# Patient Record
Sex: Female | Born: 1990 | Race: Black or African American | Hispanic: No | Marital: Single | Smoking: Never smoker
Health system: Southern US, Community
[De-identification: ages and names within clinical notes are randomized; demographics above are authoritative.]

## PROBLEM LIST (undated history)

## (undated) ENCOUNTER — Inpatient Hospital Stay (HOSPITAL_COMMUNITY): Payer: Self-pay

## (undated) DIAGNOSIS — Z789 Other specified health status: Secondary | ICD-10-CM

## (undated) DIAGNOSIS — O009 Unspecified ectopic pregnancy without intrauterine pregnancy: Secondary | ICD-10-CM

## (undated) DIAGNOSIS — B009 Herpesviral infection, unspecified: Secondary | ICD-10-CM

## (undated) HISTORY — PX: NO PAST SURGERIES: SHX2092

---
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
## 2014-06-01 ENCOUNTER — Encounter (HOSPITAL_COMMUNITY): Payer: Self-pay | Admitting: Emergency Medicine

## 2014-06-01 ENCOUNTER — Emergency Department (HOSPITAL_COMMUNITY)
Admission: EM | Admit: 2014-06-01 | Discharge: 2014-06-02 | Disposition: A | Discharge status: Home / Self Care | Payer: Self-pay | Attending: Emergency Medicine | Admitting: Emergency Medicine

## 2014-06-01 DIAGNOSIS — R51 Headache: Secondary | ICD-10-CM | POA: Insufficient documentation

## 2014-06-01 DIAGNOSIS — J301 Allergic rhinitis due to pollen: Secondary | ICD-10-CM | POA: Insufficient documentation

## 2014-06-01 MED ORDER — DM-GUAIFENESIN ER 30-600 MG PO TB12
1.0000 | ORAL_TABLET | Freq: Once | ORAL | Status: AC
  Administered 2014-06-02: 1 via ORAL
  Filled 2014-06-01: qty 1

## 2014-06-01 MED ORDER — ACETAMINOPHEN 500 MG PO TABS: 1000.0000 mg | ORAL_TABLET | Freq: Once | ORAL | Status: DC

## 2014-06-01 MED ORDER — LORATADINE 10 MG PO TABS
10.0000 mg | ORAL_TABLET | Freq: Once | ORAL | Status: AC
  Administered 2014-06-02: 10 mg via ORAL
  Filled 2014-06-01: qty 1

## 2014-06-01 NOTE — ED Notes (Signed)
Pt presents with nasal congestion and headache onset yesterday- pt took Sudafed OTC without relief.  Denies changes in vision, neuro exam normal.

## 2014-06-01 NOTE — ED Provider Notes (Signed)
CSN: [id number]     Arrival date & time 06/01/14  2134 History  This chart was scribed for Everlene Balls, MD by Eustaquio Maize, ED Scribe. This patient was seen in room B15C/B15C and the patient's care was started at 11:56 PM.      Chief Complaint  Patient presents with  . Nasal Congestion  . Headache   The history is provided by the patient. No language interpreter was used.     HPI Comments: Kim Mason is a 24 y.o. female who presents to the Emergency Department complaining of nasal congestion and headache that began earlier today while at work. She also complains of eye pain, teary eyes, and rhinorrhea. She states that she usually has seasonal allergies but that her symptoms have never been this bad. Pt admits to taking Sudafed without any relief. Pt denies being outside for an extended period of time today. She denies rash, shortness of breath, fever, or any other symptoms.    History reviewed. No pertinent past medical history. History reviewed. No pertinent past surgical history. No family history on file. History  Substance Use Topics  . Smoking status: Never Smoker   . Smokeless tobacco: Not on file  . Alcohol Use: No   OB History    No data available     Review of Systems  10 Systems reviewed and all are negative for acute change except as noted in the HPI.   Allergies  Review of patient's allergies indicates no known allergies.  Home Medications   Prior to Admission medications   Medication Sig Start Date End Date Taking? Authorizing Provider  Pseudoephedrine HCl (SUDAFED PO) Take 1 tablet by mouth every 4 (four) hours as needed (sinus congestion).   Yes Historical Provider, MD   Triage Vitals: BP 116/79 mmHg  Pulse 85  Temp(Src) 98.1 F (36.7 C) (Oral)  Resp 18  SpO2 100%  LMP 05/18/2014   Physical Exam  Constitutional: She is oriented to person, place, and time. She appears well-developed and well-nourished. No distress.  HENT:  Head: Normocephalic  and atraumatic.  Nose: Nose normal.  Mouth/Throat: Oropharynx is clear and moist. No oropharyngeal exudate.  Rhinorrhea in room.   Eyes: Conjunctivae and EOM are normal. Pupils are equal, round, and reactive to light. No scleral icterus.  Swollen eyes.   Neck: Normal range of motion. Neck supple. No JVD present. No tracheal deviation present. No thyromegaly present.  Cardiovascular: Normal rate, regular rhythm and normal heart sounds.  Exam reveals no gallop and no friction rub.   No murmur heard. Pulmonary/Chest: Effort normal and breath sounds normal. No respiratory distress. She has no wheezes. She exhibits no tenderness.  Abdominal: Soft. Bowel sounds are normal. She exhibits no distension and no mass. There is no tenderness. There is no rebound and no guarding.  Musculoskeletal: Normal range of motion. She exhibits no edema or tenderness.  Lymphadenopathy:    She has no cervical adenopathy.  Neurological: She is alert and oriented to person, place, and time. No cranial nerve deficit. She exhibits normal muscle tone.  Skin: Skin is warm and dry. No rash noted. No erythema. No pallor.  Nursing note and vitals reviewed.   ED Course  Procedures (including critical care time)  DIAGNOSTIC STUDIES: Oxygen Saturation is 100% on RA, normal by my interpretation.    COORDINATION OF CARE: 11:58 PM-Discussed treatment plan which includes Mucinex and Claritin with pt at bedside and pt agreed to plan.   Labs Review Labs Reviewed -  No data to display  Imaging Review No results found.   EKG Interpretation None      MDM   Final diagnoses:  None   patient presents emergency department for allergy-like symptoms. She has had runny eyes, swollen eyelids, rhinorrhea during the interval. She was given Claritin, Mucinex, Motrin emergency department. She is advised to continue Claritin every day during this allergy season. She currently appears well and in no acute distress. Vital signs were  within normal limits and she is safe for discharge and primary care follow-up within 3 days.  I personally performed the services described in this documentation, which was scribed in my presence. The recorded information has been reviewed and is accurate.     Everlene Balls, MD 06/02/14 [phone]

## 2014-06-02 MED ORDER — IBUPROFEN 800 MG PO TABS
800.0000 mg | ORAL_TABLET | Freq: Once | ORAL | Status: AC
  Administered 2014-06-02: 800 mg via ORAL
  Filled 2014-06-02: qty 1

## 2014-06-02 NOTE — Discharge Instructions (Signed)
Allergies Ms. Kim Mason, take loratidine (Claritin) every day for allergy symptoms and follow-up with her primary care physician within 3 days for continued management. For any pain take Motrin. If symptoms worsen come back to emergency department immediately. Thank you.  Allergies may happen from anything your body is sensitive to. This may be food, medicines, pollens, chemicals, and many other things. Food allergies can be severe and deadly.  HOME CARE  If you do not know what causes a reaction, keep a diary. Write down the foods you ate and the symptoms that followed. Avoid foods that cause reactions.  If you have red raised spots (hives) or a rash:  Take medicine as told by your doctor.  Use medicines for red raised spots and itching as needed.  Apply cold cloths (compresses) to the skin. Take a cool bath. Avoid hot baths or showers.  If you are severely allergic:  It is often necessary to go to the hospital after you have treated your reaction.  Wear your medical alert jewelry.  You and your family must learn how to give a allergy shot or use an allergy kit (anaphylaxis kit).  Always carry your allergy kit or shot with you. Use this medicine as told by your doctor if a severe reaction is occurring. GET HELP RIGHT AWAY IF:  You have trouble breathing or are making high-pitched whistling sounds (wheezing).  You have a tight feeling in your chest or throat.  You have a puffy (swollen) mouth.  You have red raised spots, puffiness (swelling), or itching all over your body.  You have had a severe reaction that was helped by your allergy kit or shot. The reaction can return once the medicine has worn off.  You think you are having a food allergy. Symptoms most often happen within 30 minutes of eating a food.  Your symptoms have not gone away within 2 days or are getting worse.  You have new symptoms.  You want to retest yourself with a food or drink you think causes an allergic  reaction. Only do this under the care of a doctor. MAKE SURE YOU:   Understand these instructions.  Will watch your condition.  Will get help right away if you are not doing well or get worse. Document Released: 06/19/2012 Document Reviewed: 06/19/2012 [facility] Patient Information 2015 [place]. This information is not intended to replace advice given to you by your health care provider. Make sure you discuss any questions you have with your health care provider.

## 2016-07-27 ENCOUNTER — Ambulatory Visit (INDEPENDENT_AMBULATORY_CARE_PROVIDER_SITE_OTHER): Payer: 59 | Admitting: Allergy and Immunology

## 2016-07-27 ENCOUNTER — Encounter: Payer: Self-pay | Admitting: Allergy and Immunology

## 2016-07-27 ENCOUNTER — Other Ambulatory Visit: Payer: Self-pay | Admitting: *Deleted

## 2016-07-27 VITALS — BP 104/70 | HR 75 | Temp 98.9°F | Resp 16 | Ht 65.5 in | Wt 140.8 lb

## 2016-07-27 DIAGNOSIS — J3089 Other allergic rhinitis: Secondary | ICD-10-CM | POA: Insufficient documentation

## 2016-07-27 DIAGNOSIS — H1045 Other chronic allergic conjunctivitis: Secondary | ICD-10-CM

## 2016-07-27 DIAGNOSIS — H1010 Acute atopic conjunctivitis, unspecified eye: Secondary | ICD-10-CM

## 2016-07-27 MED ORDER — LEVOCETIRIZINE DIHYDROCHLORIDE 5 MG PO TABS: 5.0000 mg | ORAL_TABLET | Freq: Every evening | ORAL | 5 refills | Status: DC

## 2016-07-27 MED ORDER — OLOPATADINE HCL 0.2 % OP SOLN: 1.0000 [drp] | Freq: Every day | OPHTHALMIC | 5 refills | Status: AC | PRN

## 2016-07-27 MED ORDER — FLUTICASONE PROPIONATE 93 MCG/ACT NA EXHU: 2.0000 | INHALANT_SUSPENSION | Freq: Two times a day (BID) | NASAL | 5 refills | Status: DC

## 2016-07-27 NOTE — Patient Instructions (Addendum)
Seasonal and perennial allergic rhinitis  Aeroallergen avoidance measures have been discussed and provided in written form.  A prescription has been provided for levocetirizine, 5mg  daily as needed.  A prescription has been provided for [facility], 2 actuations per nostril twice a day. Proper technique has been discussed and demonstrated.  I have also recommended nasal saline spray (i.e., Simply Saline) or nasal saline lavage (i.e., NeilMed) as needed and prior to medicated nasal sprays.  For thick post nasal drainage, nasal congestion, and/or sinus pressure, add guaifenesin [phone] mg (Mucinex) plus/minus pseudoephedrine 60-120 mg  twice daily as needed with adequate hydration as discussed. Pseudoephedrine is only to be used for short-term relief of nasal/sinus congestion. Long-term use is discouraged due to potential side effects.  The risks and benefits of aeroallergen immunotherapy have been discussed. The patient is motivated to initiate immunotherapy if insurance coverage is favorable. She will let us know how she would like to proceed.  Seasonal allergic conjunctivitis  Treatment plan as outlined above for allergic rhinitis.  A prescription has been provided for Pataday, one drop per eye daily as needed.  I have also recommended eye lubricant drops (i.e., Natural Tears) as needed.   Return in about 3 months (around 10/27/2016), or if symptoms worsen or fail to improve.  Reducing Pollen Exposure  The American Academy of Allergy, Asthma and Immunology suggests the following steps to reduce your exposure to pollen during allergy seasons.    1. Do not hang sheets or clothing out to dry; pollen may collect on these items. 2. Do not mow lawns or spend time around freshly cut grass; mowing stirs up pollen. 3. Keep windows closed at night.  Keep car windows closed while driving. 4. Minimize morning activities outdoors, a time when pollen counts are usually at their highest. 5. Stay  indoors as much as possible when pollen counts or humidity is high and on windy days when pollen tends to remain in the air longer. 6. Use air conditioning when possible.  Many air conditioners have filters that trap the pollen spores. 7. Use a HEPA room air filter to remove pollen form the indoor air you breathe.   Control of House Dust Mite Allergen  House dust mites play a major role in allergic asthma and rhinitis.  They occur in environments with high humidity wherever human skin, the food for dust mites is found. High levels have been detected in dust obtained from mattresses, pillows, carpets, upholstered furniture, bed covers, clothes and soft toys.  The principal allergen of the house dust mite is found in its feces.  A gram of dust may contain 1,000 mites and 250,000 fecal particles.  Mite antigen is easily measured in the air during house cleaning activities.    1. Encase mattresses, including the box spring, and pillow, in an air tight cover.  Seal the zipper end of the encased mattresses with wide adhesive tape. 2. Wash the bedding in water of 130 degrees Farenheit weekly.  Avoid cotton comforters/quilts and flannel bedding: the most ideal bed covering is the dacron comforter. 3. Remove all upholstered furniture from the bedroom. 4. Remove carpets, carpet padding, rugs, and non-washable window drapes from the bedroom.  Wash drapes weekly or use plastic window coverings. 5. Remove all non-washable stuffed toys from the bedroom.  Wash stuffed toys weekly. 6. Have the room cleaned frequently with a vacuum cleaner and a damp dust-mop.  The patient should not be in a room which is being cleaned and should wait 1  hour after cleaning before going into the room. 7. Close and seal all heating outlets in the bedroom.  Otherwise, the room will become filled with dust-laden air.  An electric heater can be used to heat the room. Reduce indoor humidity to less than 50%.  Do not use a  humidifier.  Control of Dog or Cat Allergen  Avoidance is the best way to manage a dog or cat allergy. If you have a dog or cat and are allergic to dog or cats, consider removing the dog or cat from the home. If you have a dog or cat but don't want to find it a new home, or if your family wants a pet even though someone in the household is allergic, here are some strategies that may help keep symptoms at bay:  1. Keep the pet out of your bedroom and restrict it to only a few rooms. Be advised that keeping the dog or cat in only one room will not limit the allergens to that room. 2. Don't pet, hug or kiss the dog or cat; if you do, wash your hands with soap and water. 3. High-efficiency particulate air (HEPA) cleaners run continuously in a bedroom or living room can reduce allergen levels over time. 4. Regular use of a high-efficiency vacuum cleaner or a central vacuum can reduce allergen levels. 5. Giving your dog or cat a bath at least once a week can reduce airborne allergen.  Control of Mold Allergen  Mold and fungi can grow on a variety of surfaces provided certain temperature and moisture conditions exist.  Outdoor molds grow on plants, decaying vegetation and soil.  The major outdoor mold, Alternaria and Cladosporium, are found in very high numbers during hot and dry conditions.  Generally, a late Summer - Fall peak is seen for common outdoor fungal spores.  Rain will temporarily lower outdoor mold spore count, but counts rise rapidly when the rainy period ends.  The most important indoor molds are Aspergillus and Penicillium.  Dark, humid and poorly ventilated basements are ideal sites for mold growth.  The next most common sites of mold growth are the bathroom and the kitchen.  Outdoor [employer] 6. Use air conditioning and keep windows closed 7. Avoid exposure to decaying vegetation. 8. Avoid leaf raking. 9. Avoid grain handling. 10. Consider wearing a face mask if working in moldy  areas.  Indoor Mold Control 1. Maintain humidity below 50%. 2. Clean washable surfaces with 5% bleach solution. 3. Remove sources e.g. Contaminated carpets.  Control of Cockroach Allergen  Cockroach allergen has been identified as an important cause of acute attacks of asthma, especially in urban settings.  There are fifty-five species of cockroach that exist in the [place], however only three, the [place], [place] species produce allergen that can affect patients with Asthma.  Allergens can be obtained from fecal particles, egg casings and secretions from cockroaches.    1. Remove food sources. 2. Reduce access to water. 3. Seal access and entry points. 4. Spray runways with 0.5-1% Diazinon or Chlorpyrifos 5. Blow boric acid power under stoves and refrigerator. 6. Place bait stations (hydramethylnon) at feeding sites.

## 2016-07-27 NOTE — Progress Notes (Signed)
New Patient Note  RE: Kim Mason MRN: [id number] DOB: 09/30/1990 Date of Office Visit: 07/27/2016  Referring provider: No ref. provider found Primary care provider: Bernerd Limbo, MD  Chief Complaint: Nasal Congestion; Sinus Problem; and Conjunctivitis   History of present illness: Kim Mason is a 26 y.o. female presenting today for initial evaluation of allergic rhinoconjunctivitis.  She complains of frequent nasal congestion, rhinorrhea, sneezing, postnasal drainage, nasal pruritus, ocular pruritus, puffy eyelids, as well as sinus pressure between the eyes and over the cheek bones.  These symptoms occur year around but occur with greater frequency and severity during the springtime.  The puffy eyelids only occur during the springtime and in association with ocular pruritus and lacrimation.  Regarding medications, she states that she has "tried everything" without adequate symptom relief.   Assessment and plan: Seasonal and perennial allergic rhinitis  Aeroallergen avoidance measures have been discussed and provided in written form.  A prescription has been provided for levocetirizine, 5mg  daily as needed.  A prescription has been provided for [facility], 2 actuations per nostril twice a day. Proper technique has been discussed and demonstrated.  I have also recommended nasal saline spray (i.e., Simply Saline) or nasal saline lavage (i.e., NeilMed) as needed and prior to medicated nasal sprays.  For thick post nasal drainage, nasal congestion, and/or sinus pressure, add guaifenesin [phone] mg (Mucinex) plus/minus pseudoephedrine 60-120 mg  twice daily as needed with adequate hydration as discussed. Pseudoephedrine is only to be used for short-term relief of nasal/sinus congestion. Long-term use is discouraged due to potential side effects.  The risks and benefits of aeroallergen immunotherapy have been discussed. The patient is motivated to initiate immunotherapy if insurance  coverage is favorable. She will let us know how she would like to proceed.  Seasonal allergic conjunctivitis  Treatment plan as outlined above for allergic rhinitis.  A prescription has been provided for Pataday, one drop per eye daily as needed.  I have also recommended eye lubricant drops (i.e., Natural Tears) as needed.   Meds ordered this encounter  Medications  . Fluticasone Propionate (XHANCE) 93 MCG/ACT EXHU    Sig: Place 2 sprays into both nostrils 2 (two) times daily.    Dispense:  32 mL    Refill:  5    [phone]  . levocetirizine (XYZAL) 5 MG tablet    Sig: Take 1 tablet (5 mg total) by mouth every evening.    Dispense:  30 tablet    Refill:  5  . Olopatadine HCl (PATADAY) 0.2 % SOLN    Sig: Place 1 drop into both eyes daily as needed.    Dispense:  2.5 mL    Refill:  5    Diagnostics: Epicutaneous testing: Positive to grass pollens, weed pollens, ragweed pollen, tree pollens, molds, cat hair, dog epithelia, and dust mite antigen. Intradermal testing: Positive to cockroach antigen.    Physical examination: Blood pressure 104/70, pulse 75, temperature 98.9 F (37.2 C), temperature source Oral, resp. rate 16, height 5' 5.5" (1.664 m), weight 140 lb 12.8 oz (63.9 kg), SpO2 98 %.  General: Alert, interactive, in no acute distress. HEENT: TMs pearly gray, turbinates edematous and pale with clear discharge, post-pharynx mildly erythematous. Neck: Supple without lymphadenopathy. Lungs: Clear to auscultation without wheezing, rhonchi or rales. CV: Normal S1, S2 without murmurs. Abdomen: Nondistended, nontender. Skin: Warm and dry, without lesions or rashes. Extremities:  No clubbing, cyanosis or edema. Neuro:   Grossly intact.  Review of systems:  Review of  systems negative except as noted in HPI / PMHx or noted below: Review of Systems  Constitutional: Negative.   HENT: Negative.   Eyes: Negative.   Respiratory: Negative.   Cardiovascular: Negative.     Gastrointestinal: Negative.   Genitourinary: Negative.   Musculoskeletal: Negative.   Skin: Negative.   Neurological: Negative.   Endo/Heme/Allergies: Negative.   Psychiatric/Behavioral: Negative.     Past medical history:  Other than issues mentioned in the history of present illness, no chronic diseases or recent hospitalizations have been reported.  Past surgical history:  History reviewed. No pertinent surgical history.  Family history: Family History  Problem Relation Age of Onset  . Allergic rhinitis Neg Hx   . Angioedema Neg Hx   . Asthma Neg Hx   . Eczema Neg Hx   . Immunodeficiency Neg Hx   . Urticaria Neg Hx     Social history: Social History   Social History  . Marital status: Single    Spouse name: N/A  . Number of children: N/A  . Years of education: N/A   Occupational History  . Not on file.   Social History Main Topics  . Smoking status: Never Smoker  . Smokeless tobacco: Never Used  . Alcohol use No  . Drug use: Unknown  . Sexual activity: Not on file   Other Topics Concern  . Not on file   Social History Narrative  . No narrative on file   Environmental History: The patient lives in a 32-year-old house with carpeting throughout, gas heat, and central air.  There is one dog in house which does not have access to her bedroom.  She is a nonsmoker and is not exposed to significant secondhand cigarette smoke.  There is no known mold/water damage in the home.  Allergies as of 07/27/2016      Reactions   Pollen Extract       Medication List       Accurate as of 07/27/16  4:58 PM. Always use your most recent med list.          fluticasone 50 MCG/ACT nasal spray Commonly known as:  FLONASE 1 spray by Each Nare route daily.   Fluticasone Propionate 93 MCG/ACT Exhu Commonly known as:  XHANCE Place 2 sprays into both nostrils 2 (two) times daily.   levocetirizine 5 MG tablet Commonly known as:  XYZAL Take 1 tablet (5 mg total) by mouth  every evening.   NEXPLANON 68 MG Impl implant Generic drug:  etonogestrel Nexplanon 68 mg subdermal implant  Inject 1 implant by subcutaneous route.   Olopatadine HCl 0.2 % Soln Commonly known as:  PATADAY Place 1 drop into both eyes daily as needed.       Known medication allergies: Allergies  Allergen Reactions  . Pollen Extract     I appreciate the opportunity to take part in [place] care. Please do not hesitate to contact me with questions.  Sincerely,   R. Edgar Frisk, MD

## 2016-07-27 NOTE — Assessment & Plan Note (Signed)
   Aeroallergen avoidance measures have been discussed and provided in written form.  A prescription has been provided for levocetirizine, 5mg  daily as needed.  A prescription has been provided for [facility], 2 actuations per nostril twice a day. Proper technique has been discussed and demonstrated.  I have also recommended nasal saline spray (i.e., Simply Saline) or nasal saline lavage (i.e., NeilMed) as needed and prior to medicated nasal sprays.  For thick post nasal drainage, nasal congestion, and/or sinus pressure, add guaifenesin [phone] mg (Mucinex) plus/minus pseudoephedrine 60-120 mg  twice daily as needed with adequate hydration as discussed. Pseudoephedrine is only to be used for short-term relief of nasal/sinus congestion. Long-term use is discouraged due to potential side effects.  The risks and benefits of aeroallergen immunotherapy have been discussed. The patient is motivated to initiate immunotherapy if insurance coverage is favorable. She will let us know how she would like to proceed.

## 2016-07-27 NOTE — Assessment & Plan Note (Signed)
   Treatment plan as outlined above for allergic rhinitis.  A prescription has been provided for Pataday, one drop per eye daily as needed.  I have also recommended eye lubricant drops (i.e., Natural Tears) as needed. 

## 2018-05-12 ENCOUNTER — Other Ambulatory Visit: Payer: Self-pay | Admitting: Obstetrics and Gynecology

## 2018-05-12 DIAGNOSIS — N6310 Unspecified lump in the right breast, unspecified quadrant: Secondary | ICD-10-CM

## 2018-05-12 DIAGNOSIS — N6320 Unspecified lump in the left breast, unspecified quadrant: Secondary | ICD-10-CM

## 2018-05-17 ENCOUNTER — Ambulatory Visit
Admission: RE | Admit: 2018-05-17 | Discharge: 2018-05-17 | Disposition: A | Discharge status: Home / Self Care | Payer: 59 | Source: Ambulatory Visit | Attending: Obstetrics and Gynecology | Admitting: Obstetrics and Gynecology

## 2018-05-17 ENCOUNTER — Other Ambulatory Visit: Payer: Self-pay

## 2018-05-17 DIAGNOSIS — N6310 Unspecified lump in the right breast, unspecified quadrant: Secondary | ICD-10-CM

## 2020-02-25 DIAGNOSIS — N926 Irregular menstruation, unspecified: Secondary | ICD-10-CM | POA: Diagnosis not present

## 2020-03-13 DIAGNOSIS — O200 Threatened abortion: Secondary | ICD-10-CM | POA: Diagnosis not present

## 2020-03-13 DIAGNOSIS — N926 Irregular menstruation, unspecified: Secondary | ICD-10-CM | POA: Diagnosis not present

## 2020-03-14 DIAGNOSIS — F4320 Adjustment disorder, unspecified: Secondary | ICD-10-CM | POA: Diagnosis not present

## 2020-03-21 DIAGNOSIS — O3680X0 Pregnancy with inconclusive fetal viability, not applicable or unspecified: Secondary | ICD-10-CM | POA: Diagnosis not present

## 2020-03-21 DIAGNOSIS — O039 Complete or unspecified spontaneous abortion without complication: Secondary | ICD-10-CM | POA: Diagnosis not present

## 2020-04-04 DIAGNOSIS — F4320 Adjustment disorder, unspecified: Secondary | ICD-10-CM | POA: Diagnosis not present

## 2020-04-09 DIAGNOSIS — O039 Complete or unspecified spontaneous abortion without complication: Secondary | ICD-10-CM | POA: Diagnosis not present

## 2020-04-17 DIAGNOSIS — Z3202 Encounter for pregnancy test, result negative: Secondary | ICD-10-CM | POA: Diagnosis not present

## 2020-05-02 DIAGNOSIS — F4320 Adjustment disorder, unspecified: Secondary | ICD-10-CM | POA: Diagnosis not present

## 2020-07-02 DIAGNOSIS — Z202 Contact with and (suspected) exposure to infections with a predominantly sexual mode of transmission: Secondary | ICD-10-CM | POA: Diagnosis not present

## 2020-07-16 DIAGNOSIS — N926 Irregular menstruation, unspecified: Secondary | ICD-10-CM | POA: Diagnosis not present

## 2020-07-16 DIAGNOSIS — Z3201 Encounter for pregnancy test, result positive: Secondary | ICD-10-CM | POA: Diagnosis not present

## 2020-07-18 ENCOUNTER — Inpatient Hospital Stay (HOSPITAL_COMMUNITY): Payer: BC Managed Care – PPO

## 2020-07-18 ENCOUNTER — Other Ambulatory Visit: Payer: Self-pay

## 2020-07-18 ENCOUNTER — Encounter (HOSPITAL_COMMUNITY): Payer: Self-pay | Admitting: Obstetrics and Gynecology

## 2020-07-18 ENCOUNTER — Inpatient Hospital Stay (HOSPITAL_COMMUNITY)
Admission: AD | Admit: 2020-07-18 | Discharge: 2020-07-18 | Disposition: A | Discharge status: Home / Self Care | Payer: BC Managed Care – PPO | Attending: Obstetrics and Gynecology | Admitting: Obstetrics and Gynecology

## 2020-07-18 DIAGNOSIS — N83201 Unspecified ovarian cyst, right side: Secondary | ICD-10-CM | POA: Insufficient documentation

## 2020-07-18 DIAGNOSIS — Z3A00 Weeks of gestation of pregnancy not specified: Secondary | ICD-10-CM | POA: Diagnosis not present

## 2020-07-18 DIAGNOSIS — O26899 Other specified pregnancy related conditions, unspecified trimester: Secondary | ICD-10-CM

## 2020-07-18 DIAGNOSIS — O3481 Maternal care for other abnormalities of pelvic organs, first trimester: Secondary | ICD-10-CM | POA: Diagnosis not present

## 2020-07-18 DIAGNOSIS — Z3201 Encounter for pregnancy test, result positive: Secondary | ICD-10-CM | POA: Diagnosis not present

## 2020-07-18 DIAGNOSIS — R109 Unspecified abdominal pain: Secondary | ICD-10-CM | POA: Diagnosis not present

## 2020-07-18 DIAGNOSIS — Z3A01 Less than 8 weeks gestation of pregnancy: Secondary | ICD-10-CM | POA: Insufficient documentation

## 2020-07-18 DIAGNOSIS — O26891 Other specified pregnancy related conditions, first trimester: Secondary | ICD-10-CM | POA: Diagnosis not present

## 2020-07-18 DIAGNOSIS — O3680X0 Pregnancy with inconclusive fetal viability, not applicable or unspecified: Secondary | ICD-10-CM | POA: Diagnosis not present

## 2020-07-18 DIAGNOSIS — O209 Hemorrhage in early pregnancy, unspecified: Secondary | ICD-10-CM | POA: Diagnosis not present

## 2020-07-18 DIAGNOSIS — N83291 Other ovarian cyst, right side: Secondary | ICD-10-CM | POA: Diagnosis not present

## 2020-07-18 DIAGNOSIS — Z6790 Unspecified blood type, Rh positive: Secondary | ICD-10-CM

## 2020-07-18 LAB — WET PREP, GENITAL
Sperm: NONE SEEN
Trich, Wet Prep: NONE SEEN
Yeast Wet Prep HPF POC: NONE SEEN

## 2020-07-18 LAB — TYPE AND SCREEN
ABO/RH(D): A POS
Antibody Screen: NEGATIVE

## 2020-07-18 LAB — URINALYSIS, ROUTINE W REFLEX MICROSCOPIC
Bacteria, UA: NONE SEEN
Bilirubin Urine: NEGATIVE
Glucose, UA: NEGATIVE mg/dL
Ketones, ur: 80 mg/dL — AB
Nitrite: NEGATIVE
Protein, ur: NEGATIVE mg/dL
Specific Gravity, Urine: 1.033 — ABNORMAL HIGH (ref 1.005–1.030)
pH: 5 (ref 5.0–8.0)

## 2020-07-18 LAB — CBC
HCT: 43.5 % (ref 36.0–46.0)
Hemoglobin: 14.8 g/dL (ref 12.0–15.0)
MCH: 32.7 pg (ref 26.0–34.0)
MCHC: 34 g/dL (ref 30.0–36.0)
MCV: 96.2 fL (ref 80.0–100.0)
Platelets: 295 10*3/uL (ref 150–400)
RBC: 4.52 MIL/uL (ref 3.87–5.11)
RDW: 12.1 % (ref 11.5–15.5)
WBC: 16.2 10*3/uL — ABNORMAL HIGH (ref 4.0–10.5)
nRBC: 0 % (ref 0.0–0.2)

## 2020-07-18 LAB — POCT PREGNANCY, URINE: Preg Test, Ur: POSITIVE — AB

## 2020-07-18 LAB — HCG, QUANTITATIVE, PREGNANCY: hCG, Beta Chain, Quant, S: 381 m[IU]/mL — ABNORMAL HIGH (ref <5)

## 2020-07-18 MED ORDER — HYDROMORPHONE HCL 1 MG/ML IJ SOLN
0.5000 mg | Freq: Once | INTRAMUSCULAR | Status: AC
  Administered 2020-07-18: 0.5 mg via INTRAVENOUS
  Filled 2020-07-18: qty 1

## 2020-07-18 MED ORDER — LACTATED RINGERS IV BOLUS
1000.0000 mL | Freq: Once | INTRAVENOUS | Status: AC
  Administered 2020-07-18: 1000 mL via INTRAVENOUS

## 2020-07-18 MED ORDER — SODIUM CHLORIDE 0.9 % IV SOLN
12.5000 mg | Freq: Once | INTRAVENOUS | Status: AC
  Administered 2020-07-18: 12.5 mg via INTRAVENOUS
  Filled 2020-07-18: qty 0.5

## 2020-07-18 NOTE — MAU Provider Note (Signed)
History     CSN: [id number]  Arrival date and time: 07/18/20 1443   Event Date/Time   First Provider Initiated Contact with Patient 07/18/20 1536      Chief Complaint  Patient presents with  . Vaginal Bleeding    Cramping    30 y.o. [id number] @[redacted]w[redacted]d  by LMP presenting with LAP and spotting. Reports onset of generalized abdominal cramping 1 week ago but became a lot worse today around noon and is now unilateral on the right. Rates pain 10/10. She tried Tylenol but it didn't help. Reports spotting when she wipes today. Also had onset of N/V today. Had qhcg in office 2 days ago that was 492.  OB History    Gravida  3   Para      Term      Preterm      AB  2   Living        SAB  2   IAB      Ectopic      Multiple      Live Births              History reviewed. No pertinent past medical history.  History reviewed. No pertinent surgical history.  Family History  Problem Relation Age of Onset  . Allergic rhinitis Neg Hx   . Angioedema Neg Hx   . Asthma Neg Hx   . Eczema Neg Hx   . Immunodeficiency Neg Hx   . Urticaria Neg Hx     Social History   Tobacco Use  . Smoking status: Never Smoker  . Smokeless tobacco: Never Used  Vaping Use  . Vaping Use: Never used  Substance Use Topics  . Alcohol use: Not Currently  . Drug use: Never    Allergies:  Allergies  Allergen Reactions  . Pollen Extract     Medications Prior to Admission  Medication Sig Dispense Refill Last Dose  . prenatal vitamin w/FE, FA (PRENATAL 1 + 1) 27-1 MG TABS tablet Take 1 tablet by mouth daily at 12 noon.   07/18/2020 at Unknown time  . etonogestrel (NEXPLANON) 68 MG IMPL implant Nexplanon 68 mg subdermal implant  Inject 1 implant by subcutaneous route.     . fluticasone (FLONASE) 50 MCG/ACT nasal spray 1 spray by Each Nare route daily.     . Fluticasone Propionate (XHANCE) 93 MCG/ACT EXHU Place 2 sprays into both nostrils 2 (two) times daily. 32 mL 5   . levocetirizine (XYZAL)  5 MG tablet Take 1 tablet (5 mg total) by mouth every evening. 30 tablet 5   . Olopatadine HCl (PATADAY) 0.2 % SOLN Place 1 drop into both eyes daily as needed. 2.5 mL 5     Review of Systems  Constitutional: Negative for chills and fever.  Gastrointestinal: Positive for abdominal pain, nausea and vomiting.  Genitourinary: Positive for vaginal bleeding.   Physical Exam   Blood pressure 115/75, pulse 87, temperature 99 F (37.2 C), temperature source Oral, resp. rate 16, height 5\' 7"  (1.702 m), weight 63.6 kg, last menstrual period 06/29/2020, SpO2 100 %.  Physical Exam Vitals and nursing note reviewed.  Constitutional:      General: She is in acute distress (restless in bed).     Appearance: Normal appearance.  HENT:     Head: Normocephalic and atraumatic.  Cardiovascular:     Rate and Rhythm: Normal rate.  Pulmonary:     Effort: Pulmonary effort is normal. No respiratory distress.  Abdominal:  General: There is no distension.     Palpations: Abdomen is soft. There is no mass.     Tenderness: There is no abdominal tenderness. There is no guarding or rebound.     Hernia: No hernia is present.  Genitourinary:    Comments: External: no lesions or erythema Vagina: rugated, pink, moist, scant amt bloody discharge Uterus: non enlarged, anteverted, non tender, no CMT Adnexae: no masses, + tenderness left, + tenderness right Cervix closed  Musculoskeletal:        General: Normal range of motion.  Skin:    General: Skin is warm and dry.  Neurological:     General: No focal deficit present.     Mental Status: She is alert and oriented to person, place, and time.  Psychiatric:        Mood and Affect: Mood normal.        Behavior: Behavior normal.    Results for orders placed or performed during the hospital encounter of 07/18/20 (from the past 24 hour(s))  Urinalysis, Routine w reflex microscopic Urine, Clean Catch     Status: Abnormal   Collection Time: 07/18/20  3:34 PM   Result Value Ref Range   Color, Urine YELLOW YELLOW   APPearance CLEAR CLEAR   Specific Gravity, Urine 1.033 (H) 1.005 - 1.030   pH 5.0 5.0 - 8.0   Glucose, UA NEGATIVE NEGATIVE mg/dL   Hgb urine dipstick MODERATE (A) NEGATIVE   Bilirubin Urine NEGATIVE NEGATIVE   Ketones, ur 80 (A) NEGATIVE mg/dL   Protein, ur NEGATIVE NEGATIVE mg/dL   Nitrite NEGATIVE NEGATIVE   Leukocytes,Ua TRACE (A) NEGATIVE   RBC / HPF 0-5 0 - 5 RBC/hpf   WBC, UA 0-5 0 - 5 WBC/hpf   Bacteria, UA NONE SEEN NONE SEEN   Squamous Epithelial / LPF 0-5 0 - 5   Mucus PRESENT   Pregnancy, urine POC     Status: Abnormal   Collection Time: 07/18/20  3:36 PM  Result Value Ref Range   Preg Test, Ur POSITIVE (A) NEGATIVE  CBC     Status: Abnormal   Collection Time: 07/18/20  4:03 PM  Result Value Ref Range   WBC 16.2 (H) 4.0 - 10.5 K/uL   RBC 4.52 3.87 - 5.11 MIL/uL   Hemoglobin 14.8 12.0 - 15.0 g/dL   HCT 43.5 36.0 - 46.0 %   MCV 96.2 80.0 - 100.0 fL   MCH 32.7 26.0 - 34.0 pg   MCHC 34.0 30.0 - 36.0 g/dL   RDW 12.1 11.5 - 15.5 %   Platelets 295 150 - 400 K/uL   nRBC 0.0 0.0 - 0.2 %  hCG, quantitative, pregnancy     Status: Abnormal   Collection Time: 07/18/20  4:03 PM  Result Value Ref Range   hCG, Beta Chain, Quant, S 381 (H) <5 mIU/mL  Type and screen     Status: None   Collection Time: 07/18/20  4:03 PM  Result Value Ref Range   ABO/RH(D) A POS    Antibody Screen NEG    Sample Expiration      07/21/2020,2359 Performed at [facility], [address] [place], [place] [address]   Wet prep, genital     Status: Abnormal   Collection Time: 07/18/20  4:30 PM   Specimen: PATH Cytology Cervicovaginal Ancillary Only  Result Value Ref Range   Yeast Wet Prep HPF POC NONE SEEN NONE SEEN   Trich, Wet Prep NONE SEEN NONE SEEN  Clue Cells Wet Prep HPF POC PRESENT (A) NONE SEEN   WBC, Wet Prep HPF POC MANY (A) NONE SEEN   Sperm NONE SEEN    US OB LESS THAN 14 WEEKS WITH OB TRANSVAGINAL  Result Date:  07/18/2020 CLINICAL DATA:  Cramping positive urine pregnancy test EXAM: OBSTETRIC <14 WK [place] AND TRANSVAGINAL OB US TECHNIQUE: Both transabdominal and transvaginal ultrasound examinations were performed for complete evaluation of the gestation as well as the maternal uterus, adnexal regions, and pelvic cul-de-sac. Transvaginal technique was performed to assess early pregnancy. COMPARISON:  None. FINDINGS: Intrauterine gestational sac: None Yolk sac:  Not Visualized. Embryo:  Not Visualized. Maternal uterus/adnexae: Left ovary within normal limits and measures 2.7 x 3.1 by 3 cm and contains corpus luteal cyst. The right ovary measures 4 by 4.1 x 3.7 cm and contains a 3.4 x 3.4 x 2.9 cm hemorrhagic cyst. There is trace free fluid in the pelvis. IMPRESSION: 1. No IUP identified. Findings consistent with pregnancy of unknown location, differential of which includes IUP too early to visualize, occult ectopic pregnancy, and recent failed pregnancy. Recommend trending of HCG with repeat ultrasound as indicated 2. 3.4 cm hemorrhagic cyst in the right ovary. No follow-up imaging recommended. 3. Trace free fluid in the pelvis Electronically Signed   By: Donavan Foil M.D.   On: 07/18/2020 17:09   MAU Course  Procedures LR Dilaudid Phenergan  MDM Labs and [place] ordered and reviewed. Pain improved, pt comfortable. No further N/V. Falling qhcg and no IUGS, YS or FP seen on [place], Rt ovarian cyst present, findings could indicate early pregnancy, ectopic pregnancy, or failed pregnancy. Consult with Dr. Nelda Marseille, plan for qhcg in 2 days. Discussed results and plan with pt. K.Holshouser, CNM also notified and will update her primary OB. Stable for discharge home.   Assessment and Plan   1. Pregnancy of unknown anatomic location   2. Abdominal cramping affecting pregnancy   3. Blood type, Rh positive   4. Right ovarian cyst    Discharge home Follow up in MAU on 07/20/20 afternoon Strict Ectopic/SAB  precautions Tylenol/heating pad prn  Allergies as of 07/18/2020      Reactions   Pollen Extract       Medication List    STOP taking these medications   etonogestrel 68 MG Impl implant Commonly known as: NEXPLANON   levocetirizine 5 MG tablet Commonly known as: XYZAL     TAKE these medications   fluticasone 50 MCG/ACT nasal spray Commonly known as: FLONASE 1 spray by Each Nare route daily. What changed: Another medication with the same name was removed. Continue taking this medication, and follow the directions you see here.   Olopatadine HCl 0.2 % Soln Commonly known as: Pataday Place 1 drop into both eyes daily as needed.   prenatal vitamin w/FE, FA 27-1 MG Tabs tablet Take 1 tablet by mouth daily at 12 noon.      Julianne Handler, CNM 07/18/2020, 6:27 PM

## 2020-07-18 NOTE — Discharge Instructions (Signed)
Abdominal Pain During Pregnancy Belly (abdominal) pain is common during pregnancy. There are many possible causes. Some causes are more serious than others. Sometimes the cause is not known. Always tell your doctor if you have belly pain. Follow these instructions at home:  Do not have sex or put anything in your vagina until your pain goes away completely.  Get plenty of rest until your pain gets better.  Drink enough fluid to keep your pee (urine) pale yellow.  Take over-the-counter and prescription medicines only as told by your doctor.  Keep all follow-up visits.   Contact a doctor if:  You keep having pain after resting.  Your pain gets worse after resting.  You have lower belly pain that: ? Comes and goes at regular times. ? Spreads to your back. ? Feels like menstrual cramps.  You have pain or burning when you pee (urinate). Get help right away if:  You have a fever or chills.  You feel like it is hard to breathe.  You have bleeding from your vagina.  You are leaking fluid or tissue from your vagina.  You vomit for more than 24 hours.  You have watery poop (diarrhea) for more than 24 hours.  Your baby is moving less than usual.  You feel very weak or faint.  You have very bad pain in your upper belly. Summary  Belly pain is common during pregnancy. There are many possible causes.  If you have belly pain during pregnancy, tell your doctor right away.  Keep all follow-up visits. This information is not intended to replace advice given to you by your health care provider. Make sure you discuss any questions you have with your health care provider. Document Revised: 11/06/2019 Document Reviewed: 11/06/2019 Elsevier Patient Education  2021 [employer]  

## 2020-07-18 NOTE — MAU Note (Signed)
Patient came into MAU from home, had a positive pregnancy test at [facility] with Dr. Landry Mellow on 07/16/20. Patient reports LMP 06/29/20. Patient reports having small amount of bright red vaginal bleeding when she wiped at 1300. She also reports feeling abdominal cramping.

## 2020-07-20 ENCOUNTER — Inpatient Hospital Stay (HOSPITAL_COMMUNITY)
Admission: AD | Admit: 2020-07-20 | Discharge: 2020-07-20 | Disposition: A | Discharge status: Home / Self Care | Payer: BC Managed Care – PPO | Attending: Obstetrics & Gynecology | Admitting: Obstetrics & Gynecology

## 2020-07-20 ENCOUNTER — Other Ambulatory Visit: Payer: Self-pay

## 2020-07-20 DIAGNOSIS — O3680X0 Pregnancy with inconclusive fetal viability, not applicable or unspecified: Secondary | ICD-10-CM | POA: Insufficient documentation

## 2020-07-20 DIAGNOSIS — Z3A01 Less than 8 weeks gestation of pregnancy: Secondary | ICD-10-CM | POA: Insufficient documentation

## 2020-07-20 DIAGNOSIS — Z6790 Unspecified blood type, Rh positive: Secondary | ICD-10-CM

## 2020-07-20 LAB — HCG, QUANTITATIVE, PREGNANCY: hCG, Beta Chain, Quant, S: 342 m[IU]/mL — ABNORMAL HIGH (ref <5)

## 2020-07-20 NOTE — MAU Provider Note (Signed)
Kim Mason  is a 30 y.o. G3P0020 at [redacted]w[redacted]d who presents to MAU today for follow-up quant hCG after 48 hours. The patient was seen in MAU on 07/18/20 and had quant hCG of 381 and US showed no IUP, rt ovarian cyst. Two days prior qhcg in office was 492. She denies pain and reports scant amt of spotting when she wipes.   OB History  Gravida Para Term Preterm AB Living  3       2    SAB IAB Ectopic Multiple Live Births  2            # Outcome Date GA Lbr Len/2nd Weight Sex Delivery Anes PTL Lv  3 Current           2 SAB 09/06/15 [redacted]w[redacted]d         1 SAB 07/06/13 108w0d           No past medical history on file.  ROS: + VB no pain  BP 118/65 (BP Location: Right Arm)   Pulse 84   Temp 98.1 F (36.7 C) (Oral)   Resp 16   LMP 06/29/2020   SpO2 100% Comment: room air  CONSTITUTIONAL: Well-developed, well-nourished female in no acute distress.  MUSCULOSKELETAL: Normal range of motion.  CARDIOVASCULAR: Regular heart rate RESPIRATORY: Normal effort NEUROLOGICAL: Alert and oriented to person, place, and time.  SKIN: Not diaphoretic. No erythema. No pallor. PSYCH: Normal mood and affect. Normal behavior. Normal judgment and thought content.  Results for orders placed or performed during the hospital encounter of 07/20/20 (from the past 24 hour(s))  hCG, quantitative, pregnancy     Status: Abnormal   Collection Time: 07/20/20  1:45 PM  Result Value Ref Range   hCG, Beta Chain, Quant, S 342 (H) <5 mIU/mL   MDM: Labs ordered and reviewed. Qhcg falling, suspect miscarriage. Consult with Dr. Elly Modena, recommends rpt qhcg this week. Discussed results and plan with pt. Stable for discharge home.  A: 1. Pregnancy, location unknown   2. Blood type, Rh positive     P: Discharge home Bleeding/ectopic precautions discussed Patient will return for follow-up quant HCG at [facility], CNM notified Patient may return to MAU as needed or if her condition were to change or worsen   Allergies  as of 07/20/2020      Reactions   Pollen Extract       Medication List    TAKE these medications   fluticasone 50 MCG/ACT nasal spray Commonly known as: FLONASE 1 spray by Each Nare route daily.   Olopatadine HCl 0.2 % Soln Commonly known as: Pataday Place 1 drop into both eyes daily as needed.   prenatal vitamin w/FE, FA 27-1 MG Tabs tablet Take 1 tablet by mouth daily at 12 noon.       Julianne Handler, CNM 07/20/2020 3:00 PM

## 2020-07-20 NOTE — MAU Note (Signed)
Kim Mason is a 30 y.o. at [redacted]w[redacted]d here in MAU reporting: here for follow up hcg. Denies pain. Is having some light bleeding, not wearing a pad.  Onset of complaint: ongoing  Pain score: 0/10  Vitals:   07/20/20 1334  BP: 118/65  Pulse: 84  Resp: 16  Temp: 98.1 F (36.7 C)  SpO2: 100%     Lab orders placed from triage: hcg

## 2020-07-21 LAB — GC/CHLAMYDIA PROBE AMP (~~LOC~~) NOT AT ARMC
Chlamydia: NEGATIVE
Comment: NEGATIVE
Comment: NORMAL
Neisseria Gonorrhea: NEGATIVE

## 2020-07-28 ENCOUNTER — Inpatient Hospital Stay (HOSPITAL_COMMUNITY): Payer: BC Managed Care – PPO

## 2020-07-28 ENCOUNTER — Other Ambulatory Visit: Payer: Self-pay

## 2020-07-28 ENCOUNTER — Inpatient Hospital Stay (HOSPITAL_COMMUNITY)
Admission: AD | Admit: 2020-07-28 | Discharge: 2020-07-28 | Disposition: A | Discharge status: Home / Self Care | Payer: BC Managed Care – PPO | Attending: Obstetrics and Gynecology | Admitting: Obstetrics and Gynecology

## 2020-07-28 ENCOUNTER — Encounter (HOSPITAL_COMMUNITY): Payer: Self-pay | Admitting: Obstetrics and Gynecology

## 2020-07-28 DIAGNOSIS — Z6790 Unspecified blood type, Rh positive: Secondary | ICD-10-CM | POA: Diagnosis not present

## 2020-07-28 DIAGNOSIS — Z3A01 Less than 8 weeks gestation of pregnancy: Secondary | ICD-10-CM | POA: Insufficient documentation

## 2020-07-28 DIAGNOSIS — O0281 Inappropriate change in quantitative human chorionic gonadotropin (hCG) in early pregnancy: Secondary | ICD-10-CM | POA: Diagnosis not present

## 2020-07-28 DIAGNOSIS — O00201 Right ovarian pregnancy without intrauterine pregnancy: Secondary | ICD-10-CM | POA: Diagnosis not present

## 2020-07-28 DIAGNOSIS — N831 Corpus luteum cyst of ovary, unspecified side: Secondary | ICD-10-CM | POA: Diagnosis not present

## 2020-07-28 HISTORY — DX: Other specified health status: Z78.9

## 2020-07-28 LAB — COMPREHENSIVE METABOLIC PANEL
ALT: 29 U/L (ref 0–44)
AST: 43 U/L — ABNORMAL HIGH (ref 15–41)
Albumin: 4 g/dL (ref 3.5–5.0)
Alkaline Phosphatase: 41 U/L (ref 38–126)
Anion gap: 7 (ref 5–15)
BUN: 6 mg/dL (ref 6–20)
CO2: 26 mmol/L (ref 22–32)
Calcium: 8.9 mg/dL (ref 8.9–10.3)
Chloride: 106 mmol/L (ref 98–111)
Creatinine, Ser: 0.71 mg/dL (ref 0.44–1.00)
GFR, Estimated: >60 mL/min (ref >60)
Glucose, Bld: 84 mg/dL (ref 70–99)
Potassium: 4.1 mmol/L (ref 3.5–5.1)
Sodium: 139 mmol/L (ref 135–145)
Total Bilirubin: 0.6 mg/dL (ref 0.3–1.2)
Total Protein: 7.5 g/dL (ref 6.5–8.1)

## 2020-07-28 LAB — URINALYSIS, ROUTINE W REFLEX MICROSCOPIC
Bacteria, UA: NONE SEEN
Bilirubin Urine: NEGATIVE
Glucose, UA: NEGATIVE mg/dL
Ketones, ur: 20 mg/dL — AB
Leukocytes,Ua: NEGATIVE
Nitrite: NEGATIVE
Protein, ur: NEGATIVE mg/dL
RBC / HPF: >50 RBC/hpf — ABNORMAL HIGH (ref 0–5)
Specific Gravity, Urine: 1.018 (ref 1.005–1.030)
pH: 5 (ref 5.0–8.0)

## 2020-07-28 LAB — CBC
HCT: 39.9 % (ref 36.0–46.0)
Hemoglobin: 13.4 g/dL (ref 12.0–15.0)
MCH: 32.3 pg (ref 26.0–34.0)
MCHC: 33.6 g/dL (ref 30.0–36.0)
MCV: 96.1 fL (ref 80.0–100.0)
Platelets: 231 10*3/uL (ref 150–400)
RBC: 4.15 MIL/uL (ref 3.87–5.11)
RDW: 12.2 % (ref 11.5–15.5)
WBC: 6.1 10*3/uL (ref 4.0–10.5)
nRBC: 0 % (ref 0.0–0.2)

## 2020-07-28 LAB — HCG, QUANTITATIVE, PREGNANCY: hCG, Beta Chain, Quant, S: 169 m[IU]/mL — ABNORMAL HIGH (ref <5)

## 2020-07-28 MED ORDER — MORPHINE SULFATE (PF) 4 MG/ML IV SOLN
2.0000 mg | Freq: Once | INTRAVENOUS | Status: AC
Start: 2020-07-28 — End: 2020-07-28
  Administered 2020-07-28: 2 mg via INTRAMUSCULAR
  Filled 2020-07-28: qty 1

## 2020-07-28 MED ORDER — METHOTREXATE FOR ECTOPIC PREGNANCY
50.0000 mg/m2 | Freq: Once | INTRAMUSCULAR | Status: AC
  Administered 2020-07-28: 87 mg via INTRAMUSCULAR
  Filled 2020-07-28: qty 3.5

## 2020-07-28 NOTE — MAU Note (Signed)
Kim Mason- was seen twice in MAU, when called office to schedule f/u- they rescheduled appt for 6/2.  Has not been seen since was here 5/15

## 2020-07-28 NOTE — MAU Provider Note (Addendum)
History     CSN: [id number]  Arrival date and time: 07/28/20 1257   Event Date/Time   First Provider Initiated Contact with Patient 07/28/20 1445      Chief Complaint  Patient presents with  . Abdominal Pain  . Vaginal Bleeding  . Emesis   30 y.o. [id number] @[redacted]w[redacted]d  presenting with RLQ pain. She was seen in MAU on 07/18/20 and had quant hCG of 381 and US showed no IUP, and a rt ovarian cyst. Two days prior qhcg in office was 492. She was seen again on 07/20/20 and qhcg was 342. She was to f/u in office for serial qhcg but was given appt for 08/07/20. She reports unbearable RLQ pain for last 3 days. Rates pain 7/10. Describes as crampy and constant. She took Tylenol but didn't help much. Her bleeding is light, only needing a panty liner. Also started having N/V 3 days ago.   OB History    Gravida  3   Para      Term      Preterm      AB  2   Living        SAB  2   IAB      Ectopic      Multiple      Live Births              Past Medical History:  Diagnosis Date  . Medical history non-contributory     Past Surgical History:  Procedure Laterality Date  . NO PAST SURGERIES      Family History  Problem Relation Age of Onset  . Hypertension Mother   . Healthy Father   . Allergic rhinitis Neg Hx   . Angioedema Neg Hx   . Asthma Neg Hx   . Eczema Neg Hx   . Immunodeficiency Neg Hx   . Urticaria Neg Hx     Social History   Tobacco Use  . Smoking status: Never Smoker  . Smokeless tobacco: Never Used  Vaping Use  . Vaping Use: Never used  Substance Use Topics  . Alcohol use: Not Currently  . Drug use: Never    Allergies:  Allergies  Allergen Reactions  . Pollen Extract     Medications Prior to Admission  Medication Sig Dispense Refill Last Dose  . acetaminophen (TYLENOL) 500 MG tablet Take 500 mg by mouth every 6 (six) hours as needed.    at 1200  . prenatal vitamin w/FE, FA (PRENATAL 1 + 1) 27-1 MG TABS tablet Take 1 tablet by mouth daily at 12  noon.   07/27/2020 at Unknown time  . fluticasone (FLONASE) 50 MCG/ACT nasal spray 1 spray by Each Nare route daily.     . Olopatadine HCl (PATADAY) 0.2 % SOLN Place 1 drop into both eyes daily as needed. 2.5 mL 5     Review of Systems  Gastrointestinal: Positive for abdominal pain, nausea and vomiting.  Genitourinary: Positive for vaginal bleeding.   Physical Exam   Blood pressure 123/81, pulse 77, temperature 97.9 F (36.6 C), temperature source Oral, resp. rate 18, height 5\' 7"  (1.702 m), weight 64.4 kg, last menstrual period 06/29/2020, SpO2 100 %.  Physical Exam Vitals and nursing note reviewed.  Constitutional:      General: She is not in acute distress.    Appearance: Normal appearance.  HENT:     Head: Normocephalic and atraumatic.  Cardiovascular:     Rate and Rhythm: Normal rate.  Pulmonary:  Effort: Pulmonary effort is normal. No respiratory distress.  Abdominal:     General: There is no distension.     Palpations: Abdomen is soft.     Tenderness: There is abdominal tenderness in the right lower quadrant. There is no guarding or rebound.  Musculoskeletal:        General: Normal range of motion.     Cervical back: Normal range of motion.  Skin:    General: Skin is warm and dry.  Neurological:     General: No focal deficit present.     Mental Status: She is alert and oriented to person, place, and time.  Psychiatric:        Mood and Affect: Mood normal.        Behavior: Behavior normal.    Results for orders placed or performed during the hospital encounter of 07/28/20 (from the past 24 hour(s))  Urinalysis, Routine w reflex microscopic Urine, Clean Catch     Status: Abnormal   Collection Time: 07/28/20  1:23 PM  Result Value Ref Range   Color, Urine YELLOW YELLOW   APPearance HAZY (A) CLEAR   Specific Gravity, Urine 1.018 1.005 - 1.030   pH 5.0 5.0 - 8.0   Glucose, UA NEGATIVE NEGATIVE mg/dL   Hgb urine dipstick LARGE (A) NEGATIVE   Bilirubin Urine  NEGATIVE NEGATIVE   Ketones, ur 20 (A) NEGATIVE mg/dL   Protein, ur NEGATIVE NEGATIVE mg/dL   Nitrite NEGATIVE NEGATIVE   Leukocytes,Ua NEGATIVE NEGATIVE   RBC / HPF >50 (H) 0 - 5 RBC/hpf   WBC, UA 0-5 0 - 5 WBC/hpf   Bacteria, UA NONE SEEN NONE SEEN   Squamous Epithelial / LPF 0-5 0 - 5   Mucus PRESENT   CBC     Status: None   Collection Time: 07/28/20  2:57 PM  Result Value Ref Range   WBC 6.1 4.0 - 10.5 K/uL   RBC 4.15 3.87 - 5.11 MIL/uL   Hemoglobin 13.4 12.0 - 15.0 g/dL   HCT 39.9 36.0 - 46.0 %   MCV 96.1 80.0 - 100.0 fL   MCH 32.3 26.0 - 34.0 pg   MCHC 33.6 30.0 - 36.0 g/dL   RDW 12.2 11.5 - 15.5 %   Platelets 231 150 - 400 K/uL   nRBC 0.0 0.0 - 0.2 %  hCG, quantitative, pregnancy     Status: Abnormal   Collection Time: 07/28/20  2:57 PM  Result Value Ref Range   hCG, Beta Chain, Quant, S 169 (H) <5 mIU/mL  Comprehensive metabolic panel     Status: Abnormal   Collection Time: 07/28/20  2:57 PM  Result Value Ref Range   Sodium 139 135 - 145 mmol/L   Potassium 4.1 3.5 - 5.1 mmol/L   Chloride 106 98 - 111 mmol/L   CO2 26 22 - 32 mmol/L   Glucose, Bld 84 70 - 99 mg/dL   BUN 6 6 - 20 mg/dL   Creatinine, Ser 0.71 0.44 - 1.00 mg/dL   Calcium 8.9 8.9 - 10.3 mg/dL   Total Protein 7.5 6.5 - 8.1 g/dL   Albumin 4.0 3.5 - 5.0 g/dL   AST 43 (H) 15 - 41 U/L   ALT 29 0 - 44 U/L   Alkaline Phosphatase 41 38 - 126 U/L   Total Bilirubin 0.6 0.3 - 1.2 mg/dL   GFR, Estimated >60 >60 mL/min   Anion gap 7 5 - 15    US OB Transvaginal  Result Date: 07/28/2020  CLINICAL DATA:  Pregnancy of unknown location. Abnormal rise in beta HCG. Patient is 4 weeks 1 day by LMP 06/29/2020. EXAM: TRANSVAGINAL OB ULTRASOUND TECHNIQUE: Transvaginal ultrasound was performed for complete evaluation of the gestation as well as the maternal uterus, adnexal regions, and pelvic cul-de-sac. COMPARISON:  07/18/2020 FINDINGS: Intrauterine gestational sac: None Yolk sac:  Not Visualized. Embryo:  Not Visualized.  Cardiac Activity: Not Visualized. Subchorionic hemorrhage: None visualized. The endometrium is thin and homogeneous. Maternal uterus/adnexae: Within the RIGHT adnexal region, there is a solid mass with blood flow measuring 2.1 x 2.8 x 1.8 centimeters. Mass is adjacent to but separate from the RIGHT ovary. The ovary contains a corpus luteum cyst which measures 3.2 x 2.5 x 2.8 centimeters. There is a small amount of anechoic free pelvic fluid. LEFT ovary is normal in appearance, 2.0 x 3.0 x 1.8 centimeters. IMPRESSION: Findings are consistent with RIGHT ectopic pregnancy. Critical Value/emergent results were called by telephone at the time of interpretation on 07/28/2020 at 3:34 pm to provider Gaylan Gerold who is working with Julianne Handler. She verbally acknowledged these results. Electronically Signed   By: Nolon Nations M.D.   On: 07/28/2020 15:46   MAU Course  Procedures Morphine  MDM Labs and [place] ordered and reviewed. Ectopic seen on [place]. Consult with Dr. Roselie Awkward, recommends MTX. Dr. Delora Fuel notified of presentation, clinical findings and recommendation for MTX, agrees with plan and she will arrange day 4 qhcg in office.  Discussed US findings with pt and mother.  The risks of methotrexate were reviewed including failure requiring repeat dosing or eventual surgery. She understands that methotrexate involves frequent return visits to monitor lab values and that she remains at risk of ectopic rupture until her beta is less than assay. The patient opts to proceed with methotrexate. She has no history of hepatic or renal dysfunction, has normal BUN/Cr/LFT's/platelets. She is felt to be reliable for follow-up. Side effects of photosensitivity & GI upset were discussed. She knows to avoid direct sunlight and abstain from alcohol, aspirin and aspirin-like products for two weeks. She was counseled to discontinue any MVI with folic acid. She understands to follow up on D4 (5/27) and D7 (5/30) for repeat BHCG  and was given the instruction sheet. Strict ectopic precautions were reviewed, the patient knows to call with any abdominal pain, vomiting, fainting, or any concerns with her health. Rh pos. MTX given. Stable for discharge home.  Assessment and Plan   1. Ectopic pregnancy of right ovary   2. Blood type, Rh positive    Discharge home Follow up at [facility] (pt reports appt tomorrow per Dr. Delora Fuel) Strict return precautions Pelvic rest  Allergies as of 07/28/2020      Reactions   Pollen Extract       Medication List    TAKE these medications   acetaminophen 500 MG tablet Commonly known as: TYLENOL Take 500 mg by mouth every 6 (six) hours as needed.   fluticasone 50 MCG/ACT nasal spray Commonly known as: FLONASE 1 spray by Each Nare route daily.   Olopatadine HCl 0.2 % Soln Commonly known as: Pataday Place 1 drop into both eyes daily as needed.   prenatal vitamin w/FE, FA 27-1 MG Tabs tablet Take 1 tablet by mouth daily at 12 noon.      Julianne Handler, CNM 07/28/2020, 6:41 PM

## 2020-07-28 NOTE — Discharge Instructions (Signed)
Methotrexate Treatment for an Ectopic Pregnancy Methotrexate is a medicine that treats an ectopic pregnancy. In this type of pregnancy, the fertilized egg attaches (implants) outside the uterus. An ectopic pregnancy cannot develop into a healthy baby. Methotrexate works by stopping the growth of the fertilized egg. It also helps the body absorb tissue from the egg. This takes about 2-6 weeks. An ectopic pregnancy can be life-threatening. However, most ectopic pregnancies can be successfully treated with methotrexate if they are diagnosed early. Tell a health care provider about:  Any allergies you have.  All medicines you are taking, including vitamins, herbs, eye drops, creams, and over-the-counter medicines.  Any medical conditions you have. What are the risks? Generally, this is a safe treatment. However, problems may occur, including:  Digestive problems. You may have: ? Nausea. ? Vomiting. ? Diarrhea. ? Cramping in your abdomen.  Bleeding or spotting from your vagina.  Feeling dizzy or light-headed.  Mouth sores.  Inflammation of the lining of your lungs (pneumonitis).  Damage to nearby structures or organs, such as damage to the liver.  Hair loss. There is a risk that methotrexate treatment will fail and the pregnancy will continue. There is also a risk that the ectopic pregnancy might tear or burst (rupture) during use of this medicine. What happens before the procedure?  Blood tests will be done to check how your disease-fighting system (immune system), liver, and kidneys are working.  You will also have blood tests to measure your pregnancy hormone levels and to find out your blood type.  You will be given a shot of a medicine called Rho(D) immune globulin if: ? You are Rh-negative and the father is Rh-positive. ? You are Rh-negative and the father's Rh type is unknown. What happens during the procedure?  Methotrexate will be injected into your  muscle. ? Methotrexate may be given as a single dose of medicine or a series of doses over time, depending on your response to the treatment. ? Methotrexate injections are given by a health care provider. Injection is the most common way that this medicine is used to treat an ectopic pregnancy.  You may also receive other medicines to manage your ectopic pregnancy. The procedure may vary among health care providers and hospitals. What can I expect after treatment? After your treatment, it is common to have:  Cramping in your abdomen.  Bleeding in your vagina.  Tiredness (fatigue).  Nausea.  Vomiting.  Diarrhea. Blood tests will be done at timed intervals for several days or weeks to check your pregnancy hormone levels. The blood tests will be done until the pregnancy hormone can no longer be found in the blood. If the methotrexate treatment does not work, a surgical procedure may be done to remove the ectopic pregnancy. Follow these instructions at home: Medicines  Take over-the-counter and prescription medicines only as told by your health care provider.  Do not take prescription pain medicines, aspirin, ibuprofen, naproxen, or any other NSAIDs.  Do not take folic acid, prenatal vitamins, or other vitamins that contain folic acid. Activity  Do not have sex, douche, or put anything, such as tampons, in your vagina until your health care provider says it is okay.  Limit activities that take a lot of effort as told by your health care provider. General instructions  Do not drink alcohol.  Follow instructions from your health care provider about eating restrictions, such as avoiding foods that produce a lot of gas. These foods can hide the signs of a   ruptured ectopic pregnancy.  Limit exposure to sunlight or artificial UV light such as from tanning beds. Methotrexate can make you more sensitive to the sun.  Follow instructions from your health care provider on how and when to  report any symptoms that may indicate a ruptured ectopic pregnancy.  Keep all follow-up visits. This is important.   Contact a health care provider if:  You have persistent nausea and vomiting.  You have persistent diarrhea.  You are having a reaction to the medicine. This may include: ? Unusual fatigue. ? Skin rash. Get help right away if:  Pain in your abdomen or in the area between your hip bones (pelvic area) gets worse.  You have more bleeding from your vagina.  You feel light-headed or you faint.  You are short of breath.  Your heart rate increases.  You develop a cough.  You have chills or a fever. Summary  Methotrexate is a medicine that treats an ectopic pregnancy. This type of pregnancy forms outside the uterus.  There is a risk that methotrexate treatment will fail and the pregnancy will continue. There is also a risk that the ectopic pregnancy might tear or burst during use of this medicine.  This medicine may be given in a single dose or a series of doses over time.  After your treatment, blood tests will be done at timed intervals for several days or weeks to check your pregnancy hormone levels. The blood tests will be done until no more pregnancy hormone is found in the blood. This information is not intended to replace advice given to you by your health care provider. Make sure you discuss any questions you have with your health care provider. Document Revised: 08/08/2019 Document Reviewed: 08/08/2019 Elsevier Patient Education  2021 Elsevier Inc.  

## 2020-07-28 NOTE — MAU Note (Addendum)
Recently had a miscarriage about 10days ago(dx with blood work). Was told had cyst on her ovary.  Is still bleeding, has gotten a lot heavier over the weekend. Was told to come back if pain, bleeding, vomiting got worse or continued.  The pain on her side has gotten "almost unbearable".  Plan was for expected management.

## 2020-07-29 DIAGNOSIS — O00101 Right tubal pregnancy without intrauterine pregnancy: Secondary | ICD-10-CM | POA: Diagnosis not present

## 2020-07-29 DIAGNOSIS — R110 Nausea: Secondary | ICD-10-CM | POA: Diagnosis not present

## 2020-07-30 ENCOUNTER — Inpatient Hospital Stay (HOSPITAL_COMMUNITY): Payer: BC Managed Care – PPO

## 2020-07-30 ENCOUNTER — Encounter (HOSPITAL_COMMUNITY): Payer: Self-pay | Admitting: Obstetrics & Gynecology

## 2020-07-30 ENCOUNTER — Other Ambulatory Visit: Payer: Self-pay

## 2020-07-30 ENCOUNTER — Telehealth: Payer: Self-pay | Admitting: Student

## 2020-07-30 ENCOUNTER — Inpatient Hospital Stay (HOSPITAL_COMMUNITY)
Admission: AD | Admit: 2020-07-30 | Discharge: 2020-07-30 | Disposition: A | Discharge status: Home / Self Care | Payer: BC Managed Care – PPO | Attending: Obstetrics and Gynecology | Admitting: Obstetrics and Gynecology

## 2020-07-30 DIAGNOSIS — O26891 Other specified pregnancy related conditions, first trimester: Secondary | ICD-10-CM | POA: Diagnosis not present

## 2020-07-30 DIAGNOSIS — R42 Dizziness and giddiness: Secondary | ICD-10-CM | POA: Diagnosis not present

## 2020-07-30 DIAGNOSIS — R0902 Hypoxemia: Secondary | ICD-10-CM | POA: Diagnosis not present

## 2020-07-30 DIAGNOSIS — O00101 Right tubal pregnancy without intrauterine pregnancy: Secondary | ICD-10-CM | POA: Diagnosis not present

## 2020-07-30 DIAGNOSIS — Z3A01 Less than 8 weeks gestation of pregnancy: Secondary | ICD-10-CM | POA: Diagnosis not present

## 2020-07-30 DIAGNOSIS — R000 Tachycardia, unspecified: Secondary | ICD-10-CM | POA: Diagnosis not present

## 2020-07-30 DIAGNOSIS — R1084 Generalized abdominal pain: Secondary | ICD-10-CM | POA: Diagnosis not present

## 2020-07-30 DIAGNOSIS — O26899 Other specified pregnancy related conditions, unspecified trimester: Secondary | ICD-10-CM | POA: Diagnosis not present

## 2020-07-30 DIAGNOSIS — Z20822 Contact with and (suspected) exposure to covid-19: Secondary | ICD-10-CM | POA: Insufficient documentation

## 2020-07-30 DIAGNOSIS — R109 Unspecified abdominal pain: Secondary | ICD-10-CM | POA: Diagnosis not present

## 2020-07-30 DIAGNOSIS — Z3A00 Weeks of gestation of pregnancy not specified: Secondary | ICD-10-CM | POA: Diagnosis not present

## 2020-07-30 DIAGNOSIS — R102 Pelvic and perineal pain: Secondary | ICD-10-CM | POA: Diagnosis not present

## 2020-07-30 DIAGNOSIS — O009 Unspecified ectopic pregnancy without intrauterine pregnancy: Secondary | ICD-10-CM

## 2020-07-30 DIAGNOSIS — O26851 Spotting complicating pregnancy, first trimester: Secondary | ICD-10-CM | POA: Insufficient documentation

## 2020-07-30 DIAGNOSIS — R1031 Right lower quadrant pain: Secondary | ICD-10-CM | POA: Diagnosis not present

## 2020-07-30 LAB — TYPE AND SCREEN
ABO/RH(D): A POS
Antibody Screen: NEGATIVE

## 2020-07-30 LAB — HCG, QUANTITATIVE, PREGNANCY: hCG, Beta Chain, Quant, S: 204 m[IU]/mL — ABNORMAL HIGH (ref <5)

## 2020-07-30 LAB — CBC
HCT: 38.5 % (ref 36.0–46.0)
Hemoglobin: 12.9 g/dL (ref 12.0–15.0)
MCH: 31.8 pg (ref 26.0–34.0)
MCHC: 33.5 g/dL (ref 30.0–36.0)
MCV: 94.8 fL (ref 80.0–100.0)
Platelets: 236 10*3/uL (ref 150–400)
RBC: 4.06 MIL/uL (ref 3.87–5.11)
RDW: 12 % (ref 11.5–15.5)
WBC: 8.9 10*3/uL (ref 4.0–10.5)
nRBC: 0 % (ref 0.0–0.2)

## 2020-07-30 LAB — RESP PANEL BY RT-PCR (FLU A&B, COVID) ARPGX2
Influenza A by PCR: NEGATIVE
Influenza B by PCR: NEGATIVE
SARS Coronavirus 2 by RT PCR: NEGATIVE

## 2020-07-30 MED ORDER — HYDROMORPHONE HCL 1 MG/ML IJ SOLN
1.0000 mg | Freq: Once | INTRAMUSCULAR | Status: AC
  Administered 2020-07-30: 1 mg via INTRAVENOUS
  Filled 2020-07-30: qty 1

## 2020-07-30 NOTE — MAU Note (Signed)
Pt arrived EMS with infiltrated IV. RN removed IV.

## 2020-07-30 NOTE — Telephone Encounter (Signed)
Opened in error

## 2020-07-30 NOTE — Discharge Instructions (Signed)
Return to care   If you have heavier bleeding that soaks through more that 2 pads per hour for an hour or more  If you bleed so much that you feel like you might pass out or you do pass out  If you have significant abdominal pain that is not improved with Tylenol        Ectopic Pregnancy  An ectopic pregnancy happens when a fertilized egg grows outside of the womb (uterus). Fertilized means that sperm entered the egg. The egg cannot stay alive outside of the womb. What are the causes? The most common cause is damage to a fallopian tube. This damage stops the egg from getting to the womb. Instead, the egg stays in the tube. Sometimes, an ectopic pregnancy happens in other parts of the body. What increases the risk?  Getting treatment before to help you have a baby.  A past pregnancy outside of the womb.  A past surgery to have your tubes tied.  Getting pregnant while using a device in the womb to avoid getting pregnant.  Taking birth control pills before the age of 26.  Smoking or drinking alcohol.  Having a mother who took a medicine called DES many years ago. What are the signs or symptoms? Common symptoms of this condition include:  Missing a menstrual period.  Feeling like you may vomit.  Tiredness.  Breast pain.  Other signs that you are pregnant. Other symptoms may include:  Pain during sex.  Bleeding from the vagina.  Belly pain.  A fast heartbeat, low blood pressure, and sweating.  Pain or extra pressure while pooping (having a bowel movement). If your tube tears or bursts:  You may have sudden and very bad pain in your belly.  You may feel dizzy, weak, or light-headed.  You may faint.  You may have pain in your shoulder or neck. A torn or burst tube is an emergency. It can be life-threatening. How is this treated? This condition may be treated with:  Medicine. This may be given if: ? The pregnancy is found early and you are not  bleeding. ? The tube has not torn or burst.  Surgery. This may be done to: ? Take out the pregnancy tissue. ? Stop bleeding. ? Take out part or all of the tube. ? Take out the womb. This is rare.  Blood tests.  Careful watching. Follow these instructions at home: Medicines  Take over-the-counter and prescription medicines only as told by your doctor.  If told, take steps to prevent problems with pooping (constipation). You may need to: ? Drink enough fluid to keep your pee (urine) pale yellow. ? Take medicines. You will be told what medicines to take. ? Eat foods that are high in fiber. These include beans, whole grains, and fresh fruits and vegetables. ? Limit foods that are high in fat and sugar. These include fried or sweet foods.  Ask your doctor if you should avoid driving or using machines while you are taking your medicine. General instructions  Rest or limit your activities, if told to do this.  Do not have sex for 6 weeks or as told by your doctor.  Do not put tampons, vaginal cleaning wash (douche), or other things in your vagina. Do not use these things for 6 weeks or until your doctor says it is safe to use them.  Do not lift anything that is heavier than 10 lb (4.5 kg), or the limit that you are told.  Return  to your normal activities when your doctor says that it is safe.  Keep all follow-up visits. Contact a doctor if:  You have a fever or chills.  You feel like you may vomit and you vomit. Get help right away if:  Your pain gets worse or is not helped by medicine.  You feel dizzy or weak.  You feel light-headed.  You faint.  You have sudden and very bad pain in your belly.  You have very bad pain in your shoulder or neck. Summary  An ectopic pregnancy happens when a fertilized egg grows outside the womb.  This is an emergency.  The most common cause is damage to one of the fallopian tubes.  This condition may be treated with medicine,  surgery, blood tests, or careful watching. This information is not intended to replace advice given to you by your health care provider. Make sure you discuss any questions you have with your health care provider. Document Revised: 06/15/2019 Document Reviewed: 06/05/2019 Elsevier Patient Education  Kim Mason.

## 2020-07-30 NOTE — MAU Note (Signed)
Pt arrived EMS with known ectopic pt states she received methotrexate on 07/28/2020.   Pt reports lower abdominal pain since Monday, but worse in the last hour.

## 2020-07-30 NOTE — MAU Provider Note (Signed)
History     CSN: [id number]  Arrival date and time: 07/30/20 1307   Event Date/Time   First Provider Initiated Contact with Patient 07/30/20 1324      Chief Complaint  Patient presents with  . Abdominal Pain   HPI Kim Mason is a 30 y.o. G4P0030 at [redacted]w[redacted]d who presents via EMS for abdominal pain. Patient received methotrexate on 5/23 for a right ectopic pregnancy. Reports increased abdominal pain in right lower & pelvic area over the last hour. Felt worsening pain & dizziness. Rates pain 7/10. Hasn't treated pain. Reports some vaginal spotting that has not changed. Has only eaten part of a muffin today due to lack of appetite.   OB History    Gravida  4   Para      Term      Preterm      AB  3   Living        SAB  3   IAB      Ectopic      Multiple      Live Births              Past Medical History:  Diagnosis Date  . Medical history non-contributory     Past Surgical History:  Procedure Laterality Date  . NO PAST SURGERIES      Family History  Problem Relation Age of Onset  . Hypertension Mother   . Healthy Father   . Allergic rhinitis Neg Hx   . Angioedema Neg Hx   . Asthma Neg Hx   . Eczema Neg Hx   . Immunodeficiency Neg Hx   . Urticaria Neg Hx     Social History   Tobacco Use  . Smoking status: Never Smoker  . Smokeless tobacco: Never Used  Vaping Use  . Vaping Use: Never used  Substance Use Topics  . Alcohol use: Not Currently  . Drug use: Never    Allergies:  Allergies  Allergen Reactions  . Pollen Extract     No medications prior to admission.    Review of Systems  Constitutional: Negative.   Gastrointestinal: Positive for anal bleeding. Negative for constipation, diarrhea, nausea and vomiting.  Genitourinary: Positive for vaginal bleeding.  Neurological: Positive for dizziness.   Physical Exam   Blood pressure 109/69, pulse 100, temperature 99.7 F (37.6 C), temperature source Oral, resp. rate 16, last  menstrual period 06/29/2020, SpO2 99 %.  Physical Exam Vitals and nursing note reviewed.  Constitutional:      General: She is in acute distress (patient tearful & visibly uncomfortable).     Appearance: She is well-developed.  HENT:     Head: Normocephalic and atraumatic.  Eyes:     General: No scleral icterus. Pulmonary:     Effort: Pulmonary effort is normal. No respiratory distress.  Abdominal:     General: Abdomen is flat.     Palpations: Abdomen is soft.     Tenderness: There is abdominal tenderness in the right lower quadrant. There is no guarding or rebound.  Skin:    General: Skin is warm and dry.  Neurological:     Mental Status: She is alert.  Psychiatric:        Mood and Affect: Mood normal.        Behavior: Behavior normal.     MAU Course  Procedures Results for orders placed or performed during the hospital encounter of 07/30/20 (from the past 24 hour(s))  Resp Panel by RT-PCR (Flu  A&B, Covid) Nasopharyngeal Swab     Status: None   Collection Time: 07/30/20  1:39 PM   Specimen: Nasopharyngeal Swab; Nasopharyngeal(NP) swabs in vial transport medium  Result Value Ref Range   SARS Coronavirus 2 by RT PCR NEGATIVE NEGATIVE   Influenza A by PCR NEGATIVE NEGATIVE   Influenza B by PCR NEGATIVE NEGATIVE  CBC     Status: None   Collection Time: 07/30/20  1:44 PM  Result Value Ref Range   WBC 8.9 4.0 - 10.5 K/uL   RBC 4.06 3.87 - 5.11 MIL/uL   Hemoglobin 12.9 12.0 - 15.0 g/dL   HCT 38.5 36.0 - 46.0 %   MCV 94.8 80.0 - 100.0 fL   MCH 31.8 26.0 - 34.0 pg   MCHC 33.5 30.0 - 36.0 g/dL   RDW 12.0 11.5 - 15.5 %   Platelets 236 150 - 400 K/uL   nRBC 0.0 0.0 - 0.2 %  Type and screen     Status: None   Collection Time: 07/30/20  1:44 PM  Result Value Ref Range   ABO/RH(D) A POS    Antibody Screen NEG    Sample Expiration      08/02/2020,2359 Performed at [facility], [place] [address] [place], [place] [address]   hCG, quantitative, pregnancy     Status:  Abnormal   Collection Time: 07/30/20  1:44 PM  Result Value Ref Range   hCG, Beta Chain, Quant, S 204 (H) <5 mIU/mL   US OB Transvaginal  Result Date: 07/30/2020 CLINICAL DATA:  30 year old female status post interval methotrexate for right tubal ectopic pregnancy, now presenting with persistent pain. EXAM: TRANSVAGINAL OB ULTRASOUND TECHNIQUE: Transvaginal ultrasound was performed for complete evaluation of the gestation as well as the maternal uterus, adnexal regions, and pelvic cul-de-sac. COMPARISON:  07/28/2020 obstetric scan. FINDINGS: Intrauterine gestational sac: None Maternal uterus/adnexae: Anteverted uterus with no uterine fibroids. Thin (2 mm) endometrium. No endometrial cavity fluid or focal endometrial mass. Left ovary measures 2.8 x 2.0 x 2.7 cm and is normal. No left adnexal masses. Right ovary measures 3.5 x 2.3 x 2.7 cm and contains a corpus luteum. Indistinct isoechoic 2.5 x 1.4 x 1.8 cm right adnexal solid mass between the right ovary and uterus, previously 2.8 x 1.8 x 2.1 cm, not substantially changed. No embryo or yolk sac detected in the right adnexal mass. Small amount of free fluid in the pelvic cul-de-sac, not substantially changed. IMPRESSION: No substantial change since 07/28/2020 scan. Persistent indistinct isoechoic right adnexal 2.5 x 1.4 x 1.8 cm mass compatible with right tubal ectopic pregnancy, with no embryo or yolk sac detected. Small amount of free fluid in the pelvic cul-de-sac. Electronically Signed   By: Ilona Sorrel M.D.   On: 07/30/2020 14:51    MDM Patient presents via EMS for abdominal pain. Diagnosed with ectopic pregnancy earlier this week.  Labs & imaging repeated. Adnexal mass slightly smaller in size today & no evidence of rupture. HCG stable. Hemoglobin & vital signs normal. Pain resolved after medication. Patient stable for discharge. Has day 4 lab visit with [facility] on Friday.   Assessment and Plan   1. Ectopic pregnancy without intrauterine  pregnancy   2. Abdominal pain during pregnancy in first trimester    -reviewed s/s of rupture ectopic pregnancy & reasons to return to MAU -keep scheduled lab appt on Friday  Jorje Guild 07/30/2020, 4:15 PM

## 2020-08-01 DIAGNOSIS — O00101 Right tubal pregnancy without intrauterine pregnancy: Secondary | ICD-10-CM | POA: Diagnosis not present

## 2020-08-04 ENCOUNTER — Telehealth: Payer: Self-pay | Admitting: Women's Health

## 2020-08-04 ENCOUNTER — Other Ambulatory Visit: Payer: Self-pay

## 2020-08-04 ENCOUNTER — Inpatient Hospital Stay (HOSPITAL_COMMUNITY)
Admission: AD | Admit: 2020-08-04 | Discharge: 2020-08-04 | Disposition: A | Discharge status: Home / Self Care | Payer: BC Managed Care – PPO | Attending: Obstetrics and Gynecology | Admitting: Obstetrics and Gynecology

## 2020-08-04 DIAGNOSIS — Z3A01 Less than 8 weeks gestation of pregnancy: Secondary | ICD-10-CM | POA: Insufficient documentation

## 2020-08-04 DIAGNOSIS — O00101 Right tubal pregnancy without intrauterine pregnancy: Secondary | ICD-10-CM | POA: Diagnosis not present

## 2020-08-04 LAB — HCG, QUANTITATIVE, PREGNANCY: hCG, Beta Chain, Quant, S: 108 m[IU]/mL — ABNORMAL HIGH (ref <5)

## 2020-08-04 NOTE — MAU Note (Signed)
Pt here for rp Hcg levels Day 7. Pt denies any pain, vitals WNL.

## 2020-08-04 NOTE — MAU Note (Signed)
Pt to leave once labs are drawn per Julianne Handler CNM

## 2020-08-04 NOTE — MAU Provider Note (Addendum)
Ms. SHIVAUN BILELLO  is a 30 y.o. G4P0030 at [redacted]w[redacted]d who presents to MAU today for day 7 follow-up quant hCG after MTX. The patient was seen in MAU on 07/28/20 and had quant hCG of 169 and US showed right ectopic pregnancy. She denies pain, vaginal bleeding or fever today.   OB History  Gravida Para Term Preterm AB Living  4       3    SAB IAB Ectopic Multiple Live Births  3            # Outcome Date GA Lbr Len/2nd Weight Sex Delivery Anes PTL Lv  4 Current           3 SAB 03/13/20          2 SAB 09/06/15 [redacted]w[redacted]d         1 SAB 07/06/13 [redacted]w[redacted]d           Past Medical History:  Diagnosis Date  . Medical history non-contributory     ROS: no VB no pain  BP 120/73 (BP Location: Right Arm)   Pulse 82   Temp 98 F (36.7 C) (Oral)   Resp 16   LMP 06/29/2020   SpO2 100%   CONSTITUTIONAL: Well-developed, well-nourished female in no acute distress.  MUSCULOSKELETAL: Normal range of motion.  CARDIOVASCULAR: Regular heart rate RESPIRATORY: Normal effort NEUROLOGICAL: Alert and oriented to person, place, and time.  SKIN: Not diaphoretic. No erythema. No pallor. PSYCH: Normal mood and affect. Normal behavior. Normal judgment and thought content.  No results found for this or any previous visit (from the past 24 hour(s)).  MDM: Day 4 qhcg done in office but result not on file, she was seen in MAU again on day 2 and qhcg 203. Labs drawn. Pt must go to work. Will call with results and plan, number confirmed.   A: 1. Right tubal pregnancy without intrauterine pregnancy     P: Discharge home Return precautions discussed Patient may return to MAU as needed or if her condition were to change or worsen   Julianne Handler, [place] 08/04/2020 7:07 AM

## 2020-08-04 NOTE — Telephone Encounter (Signed)
Pt called with lab results and identified by two identifiers. Patient informed of normal drop in quant from 169 on Day 0/1 to 108 today on Day 7. Prior to phone call with pt, Dr. Landry Mellow called and notified of hCG results. Reports Day 4 hCG results in office were 146 on 08/01/2020. Dr. Landry Mellow also states that patient has an appointment already scheduled with her in the office on 08/07/2020 at 8am and patient needs to keep that appointment and can discuss all necessary f/u at that time. Patient reminded of appointment and strongly encouraged to keep appt to get appropriate follow-up. Patient verbalizes understanding and questions answered to pt satisfaction.  Clarisa Fling, NP  11:33 AM 08/04/2020

## 2020-08-05 DIAGNOSIS — O00101 Right tubal pregnancy without intrauterine pregnancy: Secondary | ICD-10-CM | POA: Diagnosis not present

## 2020-08-12 DIAGNOSIS — O00101 Right tubal pregnancy without intrauterine pregnancy: Secondary | ICD-10-CM | POA: Diagnosis not present

## 2020-08-19 DIAGNOSIS — O039 Complete or unspecified spontaneous abortion without complication: Secondary | ICD-10-CM | POA: Diagnosis not present

## 2020-08-26 DIAGNOSIS — O039 Complete or unspecified spontaneous abortion without complication: Secondary | ICD-10-CM | POA: Diagnosis not present

## 2020-09-02 DIAGNOSIS — O039 Complete or unspecified spontaneous abortion without complication: Secondary | ICD-10-CM | POA: Diagnosis not present

## 2020-09-09 DIAGNOSIS — Z0000 Encounter for general adult medical examination without abnormal findings: Secondary | ICD-10-CM | POA: Diagnosis not present

## 2020-10-14 DIAGNOSIS — O039 Complete or unspecified spontaneous abortion without complication: Secondary | ICD-10-CM | POA: Diagnosis not present

## 2020-10-21 DIAGNOSIS — O09291 Supervision of pregnancy with other poor reproductive or obstetric history, first trimester: Secondary | ICD-10-CM | POA: Diagnosis not present

## 2020-10-21 DIAGNOSIS — Z8759 Personal history of other complications of pregnancy, childbirth and the puerperium: Secondary | ICD-10-CM | POA: Diagnosis not present

## 2020-10-23 DIAGNOSIS — O09291 Supervision of pregnancy with other poor reproductive or obstetric history, first trimester: Secondary | ICD-10-CM | POA: Diagnosis not present

## 2020-11-18 DIAGNOSIS — Z3490 Encounter for supervision of normal pregnancy, unspecified, unspecified trimester: Secondary | ICD-10-CM | POA: Diagnosis not present

## 2020-11-18 DIAGNOSIS — Z3481 Encounter for supervision of other normal pregnancy, first trimester: Secondary | ICD-10-CM | POA: Diagnosis not present

## 2020-11-18 DIAGNOSIS — Z3201 Encounter for pregnancy test, result positive: Secondary | ICD-10-CM | POA: Diagnosis not present

## 2020-11-18 LAB — OB RESULTS CONSOLE RUBELLA ANTIBODY, IGM: Rubella: IMMUNE

## 2020-11-18 LAB — OB RESULTS CONSOLE HEPATITIS B SURFACE ANTIGEN: Hepatitis B Surface Ag: NEGATIVE

## 2020-11-18 LAB — OB RESULTS CONSOLE HIV ANTIBODY (ROUTINE TESTING): HIV: NONREACTIVE

## 2020-12-02 DIAGNOSIS — O26899 Other specified pregnancy related conditions, unspecified trimester: Secondary | ICD-10-CM | POA: Diagnosis not present

## 2021-01-13 DIAGNOSIS — Z3482 Encounter for supervision of other normal pregnancy, second trimester: Secondary | ICD-10-CM | POA: Diagnosis not present

## 2021-01-13 DIAGNOSIS — Z3492 Encounter for supervision of normal pregnancy, unspecified, second trimester: Secondary | ICD-10-CM | POA: Diagnosis not present

## 2021-01-26 DIAGNOSIS — Z36 Encounter for antenatal screening for chromosomal anomalies: Secondary | ICD-10-CM | POA: Diagnosis not present

## 2021-03-08 NOTE — L&D Delivery Note (Signed)
Delivery Note ?Called to room as patient was found to be complete and +2 station. Fetal bradycardia in the 80s after several minutes resolve to new baseline of 110s. Bed was broken down and pushing initiated. Fetal head was delivered.  One nuchal was present but unable to reduce at perineum. Shoulders and body followed without difficulty. Infant was placed on mother's chest, mouth and nose were bulb suctioned and let out a vigorous cry. Delayed cord clamping was performed for less than 60 seconds initially in order to stimulate infant. Venous cord blood and arterial gases were collected. Placenta delivered spontaneously. Cervix, vagina, perineum and labia were inspected. A second degree perineal laceration was found and repaired using  3-0 Vicryl. A right periurethral laceration was found and numbed using local anesthetic and repaired using 4-0 Vicryl. Adequate hemostasis was noted. Uterus fundus firm. Due to brisk bleeding after placental delivery, 1g TXA was given along with Cytotec 1062mg per rectum as prophylaxis. Placenta was inspected, found to be intact with a 3 vessel cord. Counts were correct.  ? ?At 11:53 AM a viable and healthy female was delivered via Vaginal, Spontaneous (Presentation: Left Occiput Anterior).  APGAR: 9, 9; weight see infant's chart ?Placenta status: Spontaneous, Intact.  Cord: 3 vessels with the following complications: None.  Cord pH: Collected ? ?Anesthesia: Epidural ?Episiotomy: None ?Lacerations: 2nd degree;Periurethral ?Suture Repair: 3.0 vicryl and 4.0 vicryl ?Est. Blood Loss (mL): 400 ? ?Mom to postpartum.  Baby to Couplet care / Skin to Skin. ? ?MDrema Dallas?06/05/2021, 12:14 PM ? ? ? ?

## 2021-03-13 DIAGNOSIS — Z3482 Encounter for supervision of other normal pregnancy, second trimester: Secondary | ICD-10-CM | POA: Diagnosis not present

## 2021-03-13 DIAGNOSIS — Z3490 Encounter for supervision of normal pregnancy, unspecified, unspecified trimester: Secondary | ICD-10-CM | POA: Diagnosis not present

## 2021-03-13 LAB — OB RESULTS CONSOLE RPR: RPR: NONREACTIVE

## 2021-04-09 DIAGNOSIS — Z23 Encounter for immunization: Secondary | ICD-10-CM | POA: Diagnosis not present

## 2021-05-07 DIAGNOSIS — N9089 Other specified noninflammatory disorders of vulva and perineum: Secondary | ICD-10-CM | POA: Diagnosis not present

## 2021-05-22 DIAGNOSIS — Z3490 Encounter for supervision of normal pregnancy, unspecified, unspecified trimester: Secondary | ICD-10-CM | POA: Diagnosis not present

## 2021-05-22 DIAGNOSIS — Z3483 Encounter for supervision of other normal pregnancy, third trimester: Secondary | ICD-10-CM | POA: Diagnosis not present

## 2021-05-22 LAB — OB RESULTS CONSOLE GC/CHLAMYDIA
Chlamydia: NEGATIVE
Gonorrhea: NEGATIVE

## 2021-05-22 LAB — OB RESULTS CONSOLE GBS: GBS: NEGATIVE

## 2021-06-04 DIAGNOSIS — H539 Unspecified visual disturbance: Secondary | ICD-10-CM | POA: Diagnosis not present

## 2021-06-04 DIAGNOSIS — Z3A36 36 weeks gestation of pregnancy: Secondary | ICD-10-CM | POA: Diagnosis not present

## 2021-06-04 DIAGNOSIS — Z8249 Family history of ischemic heart disease and other diseases of the circulatory system: Secondary | ICD-10-CM | POA: Diagnosis not present

## 2021-06-05 ENCOUNTER — Inpatient Hospital Stay (HOSPITAL_COMMUNITY)
Admission: AD | Admit: 2021-06-05 | Discharge: 2021-06-06 | DRG: 807 | Disposition: A | Discharge status: Home / Self Care | Payer: BC Managed Care – PPO | Attending: Obstetrics and Gynecology | Admitting: Obstetrics and Gynecology

## 2021-06-05 ENCOUNTER — Other Ambulatory Visit: Payer: Self-pay

## 2021-06-05 ENCOUNTER — Inpatient Hospital Stay (HOSPITAL_COMMUNITY): Payer: BC Managed Care – PPO | Admitting: Anesthesiology

## 2021-06-05 ENCOUNTER — Encounter (HOSPITAL_COMMUNITY): Payer: Self-pay | Admitting: Obstetrics and Gynecology

## 2021-06-05 DIAGNOSIS — Z3A37 37 weeks gestation of pregnancy: Secondary | ICD-10-CM

## 2021-06-05 DIAGNOSIS — O9832 Other infections with a predominantly sexual mode of transmission complicating childbirth: Secondary | ICD-10-CM | POA: Diagnosis not present

## 2021-06-05 DIAGNOSIS — D259 Leiomyoma of uterus, unspecified: Secondary | ICD-10-CM | POA: Diagnosis present

## 2021-06-05 DIAGNOSIS — A6000 Herpesviral infection of urogenital system, unspecified: Secondary | ICD-10-CM | POA: Diagnosis present

## 2021-06-05 DIAGNOSIS — O3413 Maternal care for benign tumor of corpus uteri, third trimester: Secondary | ICD-10-CM | POA: Diagnosis present

## 2021-06-05 DIAGNOSIS — O26893 Other specified pregnancy related conditions, third trimester: Secondary | ICD-10-CM | POA: Diagnosis not present

## 2021-06-05 HISTORY — DX: Herpesviral infection, unspecified: B00.9

## 2021-06-05 HISTORY — DX: Unspecified ectopic pregnancy without intrauterine pregnancy: O00.90

## 2021-06-05 LAB — CBC
HCT: 41 % (ref 36.0–46.0)
Hemoglobin: 13.8 g/dL (ref 12.0–15.0)
MCH: 32.8 pg (ref 26.0–34.0)
MCHC: 33.7 g/dL (ref 30.0–36.0)
MCV: 97.4 fL (ref 80.0–100.0)
Platelets: 272 10*3/uL (ref 150–400)
RBC: 4.21 MIL/uL (ref 3.87–5.11)
RDW: 13.2 % (ref 11.5–15.5)
WBC: 15.7 10*3/uL — ABNORMAL HIGH (ref 4.0–10.5)
nRBC: 0 % (ref 0.0–0.2)

## 2021-06-05 LAB — TYPE AND SCREEN
ABO/RH(D): A POS
Antibody Screen: NEGATIVE

## 2021-06-05 MED ORDER — ACETAMINOPHEN 325 MG PO TABS
650.0000 mg | ORAL_TABLET | ORAL | Status: DC | PRN
  Administered 2021-06-05: 650 mg via ORAL
  Filled 2021-06-05: qty 2

## 2021-06-05 MED ORDER — ZOLPIDEM TARTRATE 5 MG PO TABS: 5.0000 mg | ORAL_TABLET | Freq: Every evening | ORAL | Status: DC | PRN

## 2021-06-05 MED ORDER — ONDANSETRON HCL 4 MG/2ML IJ SOLN: 4.0000 mg | INTRAMUSCULAR | Status: DC | PRN

## 2021-06-05 MED ORDER — IBUPROFEN 600 MG PO TABS
600.0000 mg | ORAL_TABLET | Freq: Four times a day (QID) | ORAL | Status: DC
  Administered 2021-06-05 – 2021-06-06 (×5): 600 mg via ORAL
  Filled 2021-06-05 (×4): qty 1

## 2021-06-05 MED ORDER — ONDANSETRON HCL 4 MG/2ML IJ SOLN: 4.0000 mg | Freq: Four times a day (QID) | INTRAMUSCULAR | Status: DC | PRN

## 2021-06-05 MED ORDER — SOD CITRATE-CITRIC ACID 500-334 MG/5ML PO SOLN: 30.0000 mL | ORAL | Status: DC | PRN

## 2021-06-05 MED ORDER — PHENYLEPHRINE 40 MCG/ML (10ML) SYRINGE FOR IV PUSH (FOR BLOOD PRESSURE SUPPORT): 80.0000 ug | PREFILLED_SYRINGE | INTRAVENOUS | Status: DC | PRN

## 2021-06-05 MED ORDER — LIDOCAINE HCL (PF) 1 % IJ SOLN
30.0000 mL | INTRAMUSCULAR | Status: AC | PRN
  Administered 2021-06-05: 30 mL via SUBCUTANEOUS
  Filled 2021-06-05: qty 30

## 2021-06-05 MED ORDER — PRENATAL MULTIVITAMIN CH
1.0000 | ORAL_TABLET | Freq: Every day | ORAL | Status: DC
  Administered 2021-06-06: 1 via ORAL

## 2021-06-05 MED ORDER — SIMETHICONE 80 MG PO CHEW
80.0000 mg | CHEWABLE_TABLET | ORAL | Status: DC | PRN
Start: 2021-06-05 — End: 2021-06-07

## 2021-06-05 MED ORDER — DIPHENHYDRAMINE HCL 25 MG PO CAPS: 25.0000 mg | ORAL_CAPSULE | Freq: Four times a day (QID) | ORAL | Status: DC | PRN

## 2021-06-05 MED ORDER — BENZOCAINE-MENTHOL 20-0.5 % EX AERO
1.0000 "application " | INHALATION_SPRAY | CUTANEOUS | Status: DC | PRN
  Administered 2021-06-05: 1 via TOPICAL
  Filled 2021-06-05: qty 56

## 2021-06-05 MED ORDER — FENTANYL-BUPIVACAINE-NACL 0.5-0.125-0.9 MG/250ML-% EP SOLN
12.0000 mL/h | EPIDURAL | Status: DC | PRN
  Administered 2021-06-05: 12 mL/h via EPIDURAL
  Filled 2021-06-05: qty 250

## 2021-06-05 MED ORDER — DIBUCAINE (PERIANAL) 1 % EX OINT: 1.0000 "application " | TOPICAL_OINTMENT | CUTANEOUS | Status: DC | PRN

## 2021-06-05 MED ORDER — SENNOSIDES-DOCUSATE SODIUM 8.6-50 MG PO TABS
2.0000 | ORAL_TABLET | Freq: Every day | ORAL | Status: DC
  Administered 2021-06-06: 2 via ORAL

## 2021-06-05 MED ORDER — LACTATED RINGERS IV SOLN
500.0000 mL | INTRAVENOUS | Status: DC | PRN
  Administered 2021-06-05: 500 mL via INTRAVENOUS

## 2021-06-05 MED ORDER — OXYCODONE-ACETAMINOPHEN 5-325 MG PO TABS: 1.0000 | ORAL_TABLET | ORAL | Status: DC | PRN

## 2021-06-05 MED ORDER — ACETAMINOPHEN 325 MG PO TABS: 650.0000 mg | ORAL_TABLET | ORAL | Status: DC | PRN

## 2021-06-05 MED ORDER — TRANEXAMIC ACID-NACL 1000-0.7 MG/100ML-% IV SOLN
INTRAVENOUS | Status: AC
  Filled 2021-06-05: qty 100

## 2021-06-05 MED ORDER — EPHEDRINE 5 MG/ML INJ: 10.0000 mg | INTRAVENOUS | Status: DC | PRN

## 2021-06-05 MED ORDER — WITCH HAZEL-GLYCERIN EX PADS: 1.0000 "application " | MEDICATED_PAD | CUTANEOUS | Status: DC | PRN

## 2021-06-05 MED ORDER — LIDOCAINE HCL (PF) 1 % IJ SOLN
INTRAMUSCULAR | Status: DC | PRN
Start: 2021-06-05 — End: 2021-06-05
  Administered 2021-06-05 (×2): 5 mL via EPIDURAL

## 2021-06-05 MED ORDER — DIPHENHYDRAMINE HCL 50 MG/ML IJ SOLN: 12.5000 mg | INTRAMUSCULAR | Status: DC | PRN

## 2021-06-05 MED ORDER — OXYCODONE-ACETAMINOPHEN 5-325 MG PO TABS: 2.0000 | ORAL_TABLET | ORAL | Status: DC | PRN

## 2021-06-05 MED ORDER — OXYTOCIN-SODIUM CHLORIDE 30-0.9 UT/500ML-% IV SOLN
2.5000 [IU]/h | INTRAVENOUS | Status: DC
  Administered 2021-06-05: 2.5 [IU]/h via INTRAVENOUS
  Filled 2021-06-05: qty 500

## 2021-06-05 MED ORDER — FLEET ENEMA 7-19 GM/118ML RE ENEM: 1.0000 | ENEMA | RECTAL | Status: DC | PRN

## 2021-06-05 MED ORDER — ONDANSETRON HCL 4 MG PO TABS: 4.0000 mg | ORAL_TABLET | ORAL | Status: DC | PRN

## 2021-06-05 MED ORDER — MISOPROSTOL 200 MCG PO TABS
ORAL_TABLET | ORAL | Status: AC
  Filled 2021-06-05: qty 5

## 2021-06-05 MED ORDER — TRANEXAMIC ACID-NACL 1000-0.7 MG/100ML-% IV SOLN
1000.0000 mg | INTRAVENOUS | Status: AC
  Administered 2021-06-05: 1000 mg via INTRAVENOUS

## 2021-06-05 MED ORDER — LACTATED RINGERS IV SOLN: INTRAVENOUS | Status: DC

## 2021-06-05 MED ORDER — COCONUT OIL OIL: 1.0000 "application " | TOPICAL_OIL | Status: DC | PRN

## 2021-06-05 MED ORDER — LACTATED RINGERS IV SOLN: 500.0000 mL | Freq: Once | INTRAVENOUS | Status: DC

## 2021-06-05 MED ORDER — OXYTOCIN BOLUS FROM INFUSION
333.0000 mL | Freq: Once | INTRAVENOUS | Status: AC
  Administered 2021-06-05: 333 mL via INTRAVENOUS

## 2021-06-05 MED ORDER — MISOPROSTOL 200 MCG PO TABS
1000.0000 ug | ORAL_TABLET | Freq: Once | ORAL | Status: AC
  Administered 2021-06-05: 1000 ug via RECTAL

## 2021-06-05 NOTE — Lactation Note (Signed)
This note was copied from a baby's chart. ?Lactation Consultation Note ? ?Patient Name: Kim Mason ?Today's Date: 06/05/2021 ?Reason for consult: Initial assessment;Mother's request;Primapara;1st time breastfeeding;Early term 37-38.6wks;Breastfeeding assistance ?Age:32 hours ? ?LC assisted with latching infant at the breast with signs of milk transfer.  ?Mom undecided on her feeding plan, wants to try EBF for now.  ? ?Plan 1. To feed based on cues 8-12x 24hr period. Mom to offer breasts and look for signs of milk transfer.  ?2. If unable to latch, Mom to offer colostrum on spoon 5-7 ml per feeding.  ?3. I and O sheet reviewed  ?All questions answered at the end of the visit.  ? ?Maternal Data ?Does the patient have breastfeeding experience prior to this delivery?: No ? ?Feeding ?Mother's Current Feeding Choice: Breast Milk ? ?LATCH Score ?Latch: Repeated attempts needed to sustain latch, nipple held in mouth throughout feeding, stimulation needed to elicit sucking reflex. ? ?Audible Swallowing: Spontaneous and intermittent ? ?Type of Nipple: Everted at rest and after stimulation ? ?Comfort (Breast/Nipple): Soft / non-tender ? ?Hold (Positioning): Assistance needed to correctly position infant at breast and maintain latch. ? ?LATCH Score: 8 ? ? ?Lactation Tools Discussed/Used ?  ? ?Interventions ?Interventions: Breast feeding basics reviewed;Assisted with latch;Skin to skin;Breast massage;Hand express;Breast compression;Adjust position;Support pillows;Position options;Expressed milk;Education;LC Magazine features editor;Infant Driven Feeding Algorithm education ? ?Discharge ?Pump: Personal ?[place] Program: No ? ?Consult Status ?Consult Status: Follow-up ?Date: 06/06/21 ?Follow-up type: In-patient ? ? ? ?Nilo Fallin  Nicholson[place]Springer ?06/05/2021, 6:03 PM ? ? ? ?

## 2021-06-05 NOTE — Anesthesia Procedure Notes (Signed)
Epidural ?Patient location during procedure: OB ?Start time: 06/05/2021 11:03 AM ?End time: 06/05/2021 11:18 AM ? ?Staffing ?Anesthesiologist: Duane Boston, MD ?Performed: anesthesiologist  ? ?Preanesthetic Checklist ?Completed: patient identified, IV checked, site marked, risks and benefits discussed, monitors and equipment checked, pre-op evaluation and timeout performed ? ?Epidural ?Patient position: sitting ?Prep: DuraPrep ?Patient monitoring: heart rate, cardiac monitor, continuous pulse ox and blood pressure ?Approach: midline ?Location: L2-L3 ?Injection technique: LOR saline ? ?Needle:  ?Needle type: Tuohy  ?Needle gauge: 17 G ?Needle length: 9 cm ?Needle insertion depth: 5 cm ?Catheter size: 20 Guage ?Catheter at skin depth: 10 cm ?Test dose: negative and Other ? ?Assessment ?Events: blood not aspirated, injection not painful, no injection resistance and negative IV test ? ?Additional Notes ?Informed consent obtained prior to proceeding including risk of failure, 1% risk of PDPH, risk of minor discomfort and bruising.  Discussed rare but serious complications including epidural abscess, permanent nerve injury, epidural hematoma.  Discussed alternatives to epidural analgesia and patient desires to proceed.  Timeout performed pre-procedure verifying patient name, procedure, and platelet count.  Patient tolerated procedure well. ? ? ? ? ?

## 2021-06-05 NOTE — H&P (Signed)
HPI: 31 y.o. [id number] @ 17w6destimated gestational age (as dated by LMP c/w 7 week ultrasound) presents for leakage of fluid. Confirmed in MAU grossly ruptured. Cervix 7/90/-2, vertex. ? ?No lesions noted on perineum/vagina/cervix on speculum exam in MAU by Dr. AGwenlyn Perking ? ?Leakage of fluid:  Yes ?Vaginal bleeding:  No ?Contractions:  Yes ?Fetal movement:  Yes ? ?[facility]atal [facility] ? ?ROS:  Denies fevers, chills, chest pain, visual changes, SOB, RUQ/epigastric pain, N/V, dysuria, hematuria, or sudden onset/worsening bilateral LE or facial edema. ? 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 ?Prenatal Transfer Tool  ?Maternal Diabetes: No ?Genetic Screening: Declined ?Maternal Ultrasounds/Referrals: Normal ?Fetal Ultrasounds or other Referrals:  None ?Maternal Substance Abuse:  No ?Significant Maternal Medications:  None ?Significant Maternal Lab Results: Group B Strep negative ? ? ?Prenatal Labs ?Blood type:  A Positive ?Antibody screen:  Negative ?CBC:  H/H 13/37 ?Rubella: Immune ?RPR:  Non-reactive ?Hep B:  Negative ?Hep C:  Negative ?HIV:  Negative ?GC/CT:  Negative ?Glucola:  111.4 (wnl) ? ?Immunizations: ?Tdap: Given prenatally ?Flu: Declines ? ?OBHx:  ?OB History   ? ? Gravida  ?5  ? Para  ?   ? Term  ?   ? Preterm  ?   ? AB  ?4  ? Living  ?0  ?  ? ? SAB  ?1  ? IAB  ?2  ? Ectopic  ?1  ? Multiple  ?   ? Live Births  ?0  ?   ?  ?  ? ?PMHx:  See above ?Meds:  PNV, Valtrex '500mg'$  BID ?Allergy:   ?Allergies  ?Allergen Reactions  ? Pollen Extract   ? ?SurgHx: None ?SocHx:   Denies Tobacco, ETOH, illicit drugs ? ?O: BP 110/62 (BP Location: Right Arm)   Pulse 86   Temp 97.8 ?F (36.6 ?C) (Oral)   Resp 19   Ht '5\' 6"'$  (1.676 m)   Wt 78.7 kg   LMP 06/29/2020   SpO2 99%   BMI 28.02 kg/m?  ?Gen. AAOx3, NAD ?CV.   Tachycardic ?Resp. Breathing through contractions ?Abd. Gravid, soft, non-tender throughout, no rebound/guarding ?Extr.  No bilateral LE edema, no calf tenderness bilaterally ?SVE: 7/90/-2, by primary RN in MAU ([id number])  ? ?Last [place]   ? ? ?FHT: 90s-110s baseline, moderate variability, + accels,  deceleration to 80s ?Toco: q 2 min ? ? ?Labs: see orders ? ?A/P:  31y.o. [id number]@ 316w6dho presents for normal labor. ? ?- Admit to L&D ?- Admit labs (CBC, T&S, COVID screen) ?- CEFM/Toco ?- FWB:  Category I ?- Diet:  Clear liquids ?- IVF:  LR at 125cc/hour ?- VTE Prophylaxis:  SCDs ?- GBS Status:  Negative ?- Presentation:  Cephalic by sutures ?- Pain control:  Per patient request ?- Anticipate SVD ? ?Delivery occurred upon my arrival - see delivery note. ? ?MeDrema DallasDO ?[phone]) ? ? ? ? ? ?

## 2021-06-05 NOTE — MAU Provider Note (Signed)
S: Ms. Kim Mason is a 31 y.o. [id number] at [redacted]w[redacted]d who presents to MAU today complaining contractions every 2 minutes after SROM at 0730 this AM. 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 On Valtrex suppression, denies current outbreak or prodromal symptoms.  ? ?O: BP 110/62 (BP Location: Right Arm)   Pulse 86   Temp 97.8 ?F (36.6 ?C) (Oral)   Resp 19   Ht '5\' 6"'$  (1.676 m)   Wt 78.7 kg   LMP 06/29/2020   SpO2 99%   BMI 28.02 kg/m?  ? ?GENERAL: Well-developed, well-nourished female in no acute distress, breathing through contractions  ?Pelvic: Normal appearing external genitalia without lesions, normal vagina and remaining cervix around fetal head, clear amniotic fluid  ? ?Cervical exam:  ?Dilation: 7 ?Effacement (%): 90 ?Cervical Position: Posterior ?Station: -2 ?Presentation: Vertex ?Exam by:: jolynn ? ?A: ?SIUP at 311w6d?Active labor, SROM with clear fluid  ?Hx of recurrent HSV, no active lesions noted on exam  ? ?P: ?Admit to L&D, anticipate vaginal delivery  ?Further management per labor team  ?Dr. DaDelora Fuelpdated with exam and plan of care ? ?Kim Mason ?06/05/2021 10:47 AM ? ?

## 2021-06-05 NOTE — Lactation Note (Signed)
This note was copied from a baby's chart. ?Lactation Consultation Note ? ?Patient Name: Kim Mason ?Today's Date: 06/05/2021 ?Reason for consult: L&D Initial assessment;Primapara;1st time breastfeeding;Early term 37-38.6wks ?Age:32 hours ? ?LC in to assist with first feeding in L&D.  This is a P1 Mom and an early term infant.  Baby started showing cues and LC assisted with laid back cross cradle.  Baby latched and started feeding with sucking and occasional swallows identified.   ? ?Encouraged STS with baby and watching her for feeding cues.  Mom to ask for help prn. ? ?Maternal Data ?Has patient been taught Hand Expression?: Yes ?Does the patient have breastfeeding experience prior to this delivery?: No ? ?Feeding ?Mother's Current Feeding Choice: Breast Milk ? ?LATCH Score ?Latch: Grasps breast easily, tongue down, lips flanged, rhythmical sucking. ? ?Audible Swallowing: A few with stimulation ? ?Type of Nipple: Everted at rest and after stimulation ? ?Comfort (Breast/Nipple): Soft / non-tender ? ?Hold (Positioning): Assistance needed to correctly position infant at breast and maintain latch. ? ?LATCH Score: 8 ? ? ?Interventions ?Interventions: Breast feeding basics reviewed;Assisted with latch;Skin to skin;Breast massage;Hand express;Support pillows;Position options;Adjust position ? ? ?Consult Status ?Consult Status: Follow-up from L&D ?Date: 06/05/21 ?Follow-up type: In-patient ? ? ? ?Tilda Burrow E ?06/05/2021, 12:54 PM ? ? ? ?

## 2021-06-05 NOTE — Anesthesia Preprocedure Evaluation (Signed)
Anesthesia Evaluation  ?Patient identified by MRN, date of birth, ID band ?Patient awake ? ? ? ?Reviewed: ?Allergy & Precautions, NPO status , Patient's Chart, lab work & pertinent test results ? ?Airway ?Mallampati: II ? ?TM Distance: >3 FB ?Neck ROM: Full ? ? ? Dental ? ?(+) Dental Advisory Given ?  ?Pulmonary ?neg pulmonary ROS,  ?  ?Pulmonary exam normal ? ? ? ? ? ? ? Cardiovascular ?negative cardio ROS ?Normal cardiovascular exam ? ? ?  ?Neuro/Psych ?negative neurological ROS ? negative psych ROS  ? GI/Hepatic ?negative GI ROS, Neg liver ROS,   ?Endo/Other  ?negative endocrine ROS ? Renal/GU ?negative Renal ROS  ?negative genitourinary ?  ?Musculoskeletal ?negative musculoskeletal ROS ?(+)  ? Abdominal ?  ?Peds ?negative pediatric ROS ?(+)  Hematology ?negative hematology ROS ?(+)   ?Anesthesia Other Findings ? ? Reproductive/Obstetrics ?(+) Pregnancy ? ?  ? ? ? ? ? ? ? ? ? ? ? ? ? ?  ?  ? ? ? ? ? ? ? ? ?Anesthesia Physical ?Anesthesia Plan ? ?ASA: 2 ? ?Anesthesia Plan: Epidural  ? ?Post-op Pain Management:   ? ?Induction:  ? ?PONV Risk Score and Plan:  ? ?Airway Management Planned:  ? ?Additional Equipment:  ? ?Intra-op Plan:  ? ?Post-operative Plan:  ? ?Informed Consent: I have reviewed the patients History and Physical, chart, labs and discussed the procedure including the risks, benefits and alternatives for the proposed anesthesia with the patient or authorized representative who has indicated his/her understanding and acceptance.  ? ? ? ? ? ?Plan Discussed with: Anesthesiologist ? ?Anesthesia Plan Comments:   ? ? ? ? ? ? ?Anesthesia Quick Evaluation ? ?

## 2021-06-05 NOTE — MAU Note (Signed)
.  Kim Mason is a 31 y.o. at 25w6dhere in MAU reporting: ctxs every 2 minutes and possible ROM @ 0730 today.   ? ?Onset of complaint: 0730 ?Pain score: 10 ?Vitals:  ? 06/05/21 1006  ?BP: 110/62  ?Pulse: 86  ?Resp: 19  ?Temp: 97.8 ?F (36.6 ?C)  ?SpO2: 99%  ?   ?FHT: 162 bpm w/ +FM.  Denies VB, +pink vaginal discharge ?Lab orders placed from triage:    ?

## 2021-06-06 LAB — CBC
HCT: 30.2 % — ABNORMAL LOW (ref 36.0–46.0)
Hemoglobin: 10.8 g/dL — ABNORMAL LOW (ref 12.0–15.0)
MCH: 33.8 pg (ref 26.0–34.0)
MCHC: 35.8 g/dL (ref 30.0–36.0)
MCV: 94.4 fL (ref 80.0–100.0)
Platelets: 228 10*3/uL (ref 150–400)
RBC: 3.2 MIL/uL — ABNORMAL LOW (ref 3.87–5.11)
RDW: 13.2 % (ref 11.5–15.5)
WBC: 15.6 10*3/uL — ABNORMAL HIGH (ref 4.0–10.5)
nRBC: 0 % (ref 0.0–0.2)

## 2021-06-06 LAB — RPR: RPR Ser Ql: NONREACTIVE

## 2021-06-06 MED ORDER — IBUPROFEN 600 MG PO TABS: 600.0000 mg | ORAL_TABLET | Freq: Four times a day (QID) | ORAL | 0 refills | Status: DC

## 2021-06-06 NOTE — Lactation Note (Signed)
This note was copied from a baby's chart. ?Lactation Consultation Note ? ?Patient Name: Kim Mason ?Today's Date: 06/06/2021 ?Reason for consult: Mother's request;1st time breastfeeding;Early term 37-38.6wks ?Age:32 hours, ETI female infant with -3% weight loss. ?Mom and infant will be discharge today mom has upcoming appointment with [facility] on 06/08/21 at 1300 pm. ?Per mom, she does have DEBP at home. ?Per mom, sometimes she is feeling pinching with infant's latch. ?LC discussed making sure infant's mouth is wide, bring infant chin first when latching. ?Mom will try a new latch position, ,mom latched infant on her right breast using the football hold position with pillow support, mom brought infant chin first, infant latched with depth, per mom she feels a tug and no pain with latch. ?Infant was still actively  breastfeeding after 18 minutes when [place] left the room. ?LC discussed with mom if she feels pinch to break latch and re-latch infant at the breast. ?Mom's Plan: ?1- Mom will continue to BF infant according to hunger cues, 8 to 12+ or more times within 24 hours, skin to skin. ?2-LC mention due to infant being ETI to limit total feeding to 30 minutes or less and don't make infant wait to BF, do not go past 3 hours. ?3-Mom knows she can hand express and give infant back extra volume of EBM after latching infant at the breast.  ?4-LC discussed engorgement treatment and prevention, how you know BF is going well, warning signs in infant of dehydration infant.  ?5-LC gave mom community resources after hospital discharge: [place] hotline, online BF support group, [email or web address] ?Maternal Data ?  ? ?Feeding ?Mother's Current Feeding Choice: Breast Milk ? ?LATCH Score ?Latch: Grasps breast easily, tongue down, lips flanged, rhythmical sucking. ? ?Audible Swallowing: A few with stimulation ? ?Type of Nipple: Everted at rest and after stimulation ? ?Comfort (Breast/Nipple): Soft / non-tender ? ?Hold (Positioning):  Assistance needed to correctly position infant at breast and maintain latch. ? ?LATCH Score: 8 ? ? ?Lactation Tools Discussed/Used ?  ? ?Interventions ?Interventions: Skin to skin;Breast compression;Adjust position;Support pillows;Position options;Education;LC Services brochure ? ?Discharge ?Discharge Education: Engorgement and breast care;Outpatient recommendation;Warning signs for feeding baby ? ?Consult Status ?Consult Status: Complete ?Date: 06/06/21 ?Follow-up type: In-patient ? ? ? ?Vicente Serene ?06/06/2021, 7:15 PM ? ? ? ?

## 2021-06-06 NOTE — Anesthesia Postprocedure Evaluation (Signed)
Anesthesia Post Note ? ?Patient: CALDONIA LEAP ? ?Procedure(s) Performed: AN AD HOC LABOR EPIDURAL ? ?  ? ?Patient location during evaluation: Mother Baby ?Anesthesia Type: Epidural ?Level of consciousness: awake and alert ?Pain management: pain level controlled ?Vital Signs Assessment: post-procedure vital signs reviewed and stable ?Respiratory status: spontaneous breathing, nonlabored ventilation and respiratory function stable ?Cardiovascular status: stable ?Postop Assessment: no headache, no backache and epidural receding ?Anesthetic complications: no ? ? ?No notable events documented. ? ?Last Vitals:  ?Vitals:  ? 06/05/21 2345 06/06/21 0439  ?BP: 115/71 115/73  ?Pulse: 99 99  ?Resp: 16 17  ?Temp: 36.8 ?C 36.9 ?C  ?SpO2: 100% 99%  ?  ?Last Pain:  ?Vitals:  ? 06/06/21 0439  ?TempSrc: Oral  ?PainSc: 6   ? ?Pain Goal:   ? ?  ?  ?  ?  ?  ?  ?Epidural/Spinal Function Cutaneous sensation: Normal sensation (06/06/21 0815) ? ?Marshal Schrecengost ? ? ? ? ?

## 2021-06-06 NOTE — Progress Notes (Signed)
Postpartum Note Day #1 ? ?S:  Patient doing well.  Pain controlled.  Tolerating regular diet.   Ambulating and voiding without difficulty. If infant is discharged, she would like to go home. Breastfeeding is going well but does not seem to be producing enough in the left breast and baby has preference for the right breast. Denies fevers, chills, chest pain, SOB, N/V, or worsening bilateral LE edema. ? ?Lochia: Minimal ?Infant feeding:  Breast ?Circumcision:  N/A, female infant ?Contraception:  None ? ?O: Temp:  [97.8 ?F (36.6 ?C)-99.5 ?F (37.5 ?C)] 98.5 ?F (36.9 ?C) (04/01 0439) ?Pulse Rate:  [86-151] 99 (04/01 0439) ?Resp:  [16-20] 17 (04/01 0439) ?BP: (110-136)/(62-93) 115/73 (04/01 0439) ?SpO2:  [99 %-100 %] 99 % (04/01 0439) ?Weight:  [78.7 kg] 78.7 kg (03/31 1003) ?Gen: NAD, pleasant and cooperative ?Resp: No increased work of breathing ?Abdomen: soft, non-distended, non-tender throughout ?Uterus: firm, non-tender, below umbilicus ?Ext: No bilateral LE edema, no bilateral calf tenderness ? ?Labs:  ?Recent Labs  ?  06/05/21 ?1044 06/06/21 ?0522  ?HGB 13.8 10.8*  ? ? ?A/P: Patient is a 31 y.o. [id number] PPD#1 s/p SVD. ? ?- Pain well controlled  ?- GU: UOP is adequate ?- GI: Tolerating regular diet ?- Activity: encouraged sitting up to chair and ambulation as tolerated ?- DVT Prophylaxis: Ambulation ?- Labs: as above ? ?Disposition:  D/C home today or tomorrow, pending infant discharge.  ? ?Drema Dallas, DO ?[phone] (office) ? ? ? ? ? ? ?

## 2021-06-06 NOTE — Discharge Summary (Signed)
Postpartum Discharge Summary ? ?Date of Service: 06/06/21  ?   ?Patient Name: Kim Mason ?DOB: 01-01-1991 ?MRN: [id number] ? ?Date of admission: 06/05/2021 ?Delivery date:06/05/2021  ?Delivering provider: Elizardo Chilson  ?Date of discharge: 06/06/2021 ? ?Admitting diagnosis: Normal labor and delivery [O80] ?Intrauterine pregnancy: [redacted]w[redacted]d    ?Secondary diagnosis:  Principal Problem: ?  Normal labor and delivery ?HSV2 Positive (on suppression) ?History of ectopic pregnancy s/p medical management ?Anterior uterine fibroid (2cm) ?Additional problems: None    ?Discharge diagnosis: Term Pregnancy Delivered                                              ?Post partum procedures: None ?Augmentation: N/A ?Complications: None ? ?Hospital course: Onset of Labor With Vaginal Delivery      ?31y.o. yo [id number] 338w6das admitted in Active Labor on 06/05/2021. Patient had an uncomplicated labor course as follows:  ?Membrane Rupture Time/Date: 7:30 AM ,06/05/2021   ?Delivery Method:Vaginal, Spontaneous  ?Episiotomy: None  ?Lacerations:  2nd degree;Periurethral  ?Patient had an uncomplicated postpartum course.  She is ambulating, tolerating a regular diet, passing flatus, and urinating well. Patient is discharged home in stable condition on 06/06/21. ? ?Newborn Data: ?Birth date:06/05/2021  ?Birth time:11:53 AM  ?Gender:Female  ?Living status:Living  ?Apgars:9 ,9  ?Weight:2750 g  ? ?Magnesium Sulfate received: No ?BMZ received: No ?Rhophylac:N/A ?MMR:N/A ?T-DaP:Given prenatally ?Flu: No ?Transfusion:No ? ?Physical exam  ?Vitals:  ? 06/05/21 1505 06/05/21 1939 06/05/21 2345 06/06/21 0439  ?BP: 115/82 122/78 115/71 115/73  ?Pulse: (!) 104 96 99 99  ?Resp: _0 ?Temp: 99.5 ?F (37.5 ?C) 99.3 ?F (37.4 ?C) 98.3 ?F (36.8 ?C) 98.5 ?F (36.9 ?C)  ?TempSrc: Oral Oral Oral Oral  ?SpO2: 100% 99% 100% 99%  ?Weight:      ?Height:      ? ?General: alert, cooperative, and no distress ?Lochia: appropriate ?Uterine Fundus: firm ?Incision: N/A ?DVT  Evaluation: No evidence of DVT seen on physical exam. ?No cords or calf tenderness. ?Labs: ?Lab Results  ?Component Value Date  ? WBC 15.6 (H) 06/06/2021  ? HGB 10.8 (L) 06/06/2021  ? HCT 30.2 (L) 06/06/2021  ? MCV 94.4 06/06/2021  ? PLT 228 06/06/2021  ? ? ?  Latest Ref Rng & Units 07/28/2020  ?  2:57 PM  ?CMP  ?Glucose 70 - 99 mg/dL 84    ?BUN 6 - 20 mg/dL 6    ?Creatinine 0.44 - 1.00 mg/dL 0.71    ?Sodium 135 - 145 mmol/L 139    ?Potassium 3.5 - 5.1 mmol/L 4.1    ?Chloride 98 - 111 mmol/L 106    ?CO2 22 - 32 mmol/L 26    ?Calcium 8.9 - 10.3 mg/dL 8.9    ?Total Protein 6.5 - 8.1 g/dL 7.5    ?Total Bilirubin 0.3 - 1.2 mg/dL 0.6    ?Alkaline Phos 38 - 126 U/L 41    ?AST 15 - 41 U/L 43    ?ALT 0 - 44 U/L 29    ? ?Edinburgh Score: ?   ? View : No data to display.  ?  ?  ?  ? ? ? ? ?After visit meds:  ?Allergies as of 06/06/2021   ? ?   Reactions  ? Pollen Extract Itching, Swelling  ? ?  ? ?  ?Medication List  ?  ? ?  STOP taking these medications   ? ?acetaminophen 500 MG tablet ?Commonly known as: TYLENOL ?  ?aspirin 81 MG chewable tablet ?  ?fluticasone 50 MCG/ACT nasal spray ?Commonly known as: FLONASE ?  ?ondansetron 4 MG tablet ?Commonly known as: ZOFRAN ?  ? ?  ? ?TAKE these medications   ? ?ibuprofen 600 MG tablet ?Commonly known as: ADVIL ?Take 1 tablet (600 mg total) by mouth every 6 (six) hours. ?  ?multivitamin-prenatal 27-0.8 MG Tabs tablet ?Take 1 tablet by mouth daily at 12 noon. ?  ?Olopatadine HCl 0.2 % Soln ?Commonly known as: Pataday ?Place 1 drop into both eyes daily as needed. ?  ?valACYclovir 500 MG tablet ?Commonly known as: VALTREX ?Take 500 mg by mouth 2 (two) times daily. ?  ? ?  ? ? ? ?Discharge home in stable condition ?Infant Feeding: Breast ?Infant Disposition:home with mother ?Discharge instruction: per After Visit Summary and Postpartum booklet. ?Activity: Advance as tolerated. Pelvic rest for 6 weeks.  ?Diet: routine diet ?Anticipated Birth Control:  None ?Postpartum Appointment:6  weeks ?Additional Postpartum F/U:  None ?Future Appointments:No future appointments. ?Follow up Visit: ? Follow-up Information   ? ? Drema Dallas, DO Follow up in 6 week(s).   ?Specialty: Obstetrics and Gynecology ?Why: Our office will arrange your 6 week postpartum visit. ?Contact information: ?[address] ?[address] ?[place] [address] ?[phone] ? ? ?  ?  ? ?  ?  ? ?  ? ? ? ?  ? ?06/06/2021 ?Drema Dallas, DO ? ? ?

## 2021-06-16 ENCOUNTER — Telehealth (HOSPITAL_COMMUNITY): Payer: Self-pay | Admitting: *Deleted

## 2021-06-16 NOTE — Telephone Encounter (Signed)
Attempted hospital discharge follow-up call. Left message for patient to return RN call. Erline Levine, RN, 06/16/21, [phone] ?

## 2021-06-22 DIAGNOSIS — M5489 Other dorsalgia: Secondary | ICD-10-CM | POA: Diagnosis not present

## 2021-07-15 ENCOUNTER — Other Ambulatory Visit (HOSPITAL_COMMUNITY): Payer: Self-pay | Admitting: Interventional Radiology

## 2021-07-15 ENCOUNTER — Telehealth (HOSPITAL_COMMUNITY): Payer: Self-pay

## 2021-07-15 DIAGNOSIS — Z8489 Family history of other specified conditions: Secondary | ICD-10-CM

## 2021-07-15 DIAGNOSIS — Z8249 Family history of ischemic heart disease and other diseases of the circulatory system: Secondary | ICD-10-CM

## 2021-07-15 DIAGNOSIS — H539 Unspecified visual disturbance: Secondary | ICD-10-CM

## 2021-07-15 NOTE — Telephone Encounter (Signed)
Called pt regarding recent referral, no answer, left vm. AW ?

## 2021-07-22 ENCOUNTER — Telehealth (HOSPITAL_COMMUNITY): Payer: Self-pay

## 2021-07-22 NOTE — Telephone Encounter (Signed)
Called to schedule cta head/neck, no answer, left vm. AW 

## 2021-08-05 ENCOUNTER — Ambulatory Visit (HOSPITAL_COMMUNITY)
Admission: RE | Admit: 2021-08-05 | Discharge: 2021-08-05 | Disposition: A | Discharge status: Home / Self Care | Payer: BC Managed Care – PPO | Source: Ambulatory Visit | Attending: Interventional Radiology | Admitting: Interventional Radiology

## 2021-08-05 DIAGNOSIS — M50322 Other cervical disc degeneration at C5-C6 level: Secondary | ICD-10-CM | POA: Diagnosis not present

## 2021-08-05 DIAGNOSIS — M50321 Other cervical disc degeneration at C4-C5 level: Secondary | ICD-10-CM | POA: Diagnosis not present

## 2021-08-05 DIAGNOSIS — H539 Unspecified visual disturbance: Secondary | ICD-10-CM | POA: Diagnosis not present

## 2021-08-05 DIAGNOSIS — Z8489 Family history of other specified conditions: Secondary | ICD-10-CM | POA: Insufficient documentation

## 2021-08-05 DIAGNOSIS — E041 Nontoxic single thyroid nodule: Secondary | ICD-10-CM | POA: Insufficient documentation

## 2021-08-05 DIAGNOSIS — Z8249 Family history of ischemic heart disease and other diseases of the circulatory system: Secondary | ICD-10-CM | POA: Diagnosis not present

## 2021-08-05 DIAGNOSIS — I771 Stricture of artery: Secondary | ICD-10-CM | POA: Diagnosis not present

## 2021-08-05 MED ORDER — IOHEXOL 350 MG/ML SOLN
75.0000 mL | Freq: Once | INTRAVENOUS | Status: AC | PRN
  Administered 2021-08-05: 75 mL via INTRAVENOUS

## 2021-08-11 ENCOUNTER — Telehealth (HOSPITAL_COMMUNITY): Payer: Self-pay

## 2021-08-11 NOTE — Telephone Encounter (Signed)
Pt agreed to f/u in 2 years with a cta head/neck. She will also f/u with PCP regarding thyroid nodule. AW

## 2022-01-01 ENCOUNTER — Ambulatory Visit
Admission: EM | Admit: 2022-01-01 | Discharge: 2022-01-01 | Disposition: A | Discharge status: Home / Self Care | Payer: BC Managed Care – PPO | Attending: Physician Assistant | Admitting: Physician Assistant

## 2022-01-01 DIAGNOSIS — M25512 Pain in left shoulder: Secondary | ICD-10-CM

## 2022-01-01 MED ORDER — PREDNISONE 20 MG PO TABS: 40.0000 mg | ORAL_TABLET | Freq: Every day | ORAL | 0 refills | Status: AC

## 2022-01-01 MED ORDER — TIZANIDINE HCL 4 MG PO TABS: 4.0000 mg | ORAL_TABLET | Freq: Four times a day (QID) | ORAL | 0 refills | Status: AC | PRN

## 2022-01-01 NOTE — ED Triage Notes (Signed)
Pt presents with ongoing left shoulder pain X 1 month that is unrelieved with warm compresses and OTC medication; pt states she associates it with carrying her newborn around.

## 2022-01-01 NOTE — ED Provider Notes (Signed)
[facility]    CSN: [id number] Arrival date & time: 01/01/22  1004      History   Chief Complaint Chief Complaint  Patient presents with   Shoulder Pain    HPI Kim Mason is a 31 y.o. female.   Patient here today for evaluation of left shoulder pain that started about a month ago. She reports she thinks it is due to carrying her daughter, and the way she has been reaching over the bassinet for her daughter. She notes that pain is worse in the night. She states she will feel stiffness, specifically to her left trapezius. She has not had any numbness or tingling. She has taken OTC meds with mild relief. She denies any injury to her shoulder recently or in the past.   The history is provided by the patient.  Shoulder Pain Associated symptoms: no fever     Past Medical History:  Diagnosis Date   Ectopic pregnancy    Herpes    Medical history non-contributory     Patient Active Problem List   Diagnosis Date Noted   Normal labor and delivery 06/05/2021   Seasonal and perennial allergic rhinitis 07/27/2016   Seasonal allergic conjunctivitis 07/27/2016    Past Surgical History:  Procedure Laterality Date   NO PAST SURGERIES      OB History     Gravida  5   Para  1   Term  1   Preterm      AB  4   Living  1      SAB  1   IAB  2   Ectopic  1   Multiple  0   Live Births  1            Home Medications    Prior to Admission medications   Medication Sig Start Date End Date Taking? Authorizing Provider  predniSONE (DELTASONE) 20 MG tablet Take 2 tablets (40 mg total) by mouth daily with breakfast for 5 days. 01/01/22 01/06/22 Yes Francene Finders, PA-C  tiZANidine (ZANAFLEX) 4 MG tablet Take 1 tablet (4 mg total) by mouth every 6 (six) hours as needed for muscle spasms. 01/01/22  Yes Francene Finders, PA-C  ibuprofen (ADVIL) 600 MG tablet Take 1 tablet (600 mg total) by mouth every 6 (six) hours. 06/06/21   Drema Dallas, DO   Olopatadine HCl (PATADAY) 0.2 % SOLN Place 1 drop into both eyes daily as needed. 07/27/16   Bobbitt, [place] Muta, MD  Prenatal Vit-Fe Fumarate-FA (MULTIVITAMIN-PRENATAL) 27-0.8 MG TABS tablet Take 1 tablet by mouth daily at 12 noon.    [provider]  valACYclovir (VALTREX) 500 MG tablet Take 500 mg by mouth 2 (two) times daily.    [provider]    Family History Family History  Problem Relation Age of Onset   Hypertension Mother    Birth defects Father    Healthy Father    Allergic rhinitis Neg Hx    Angioedema Neg Hx    Asthma Neg Hx    Eczema Neg Hx    Immunodeficiency Neg Hx    Urticaria Neg Hx     Social History Social History   Tobacco Use   Smoking status: Never   Smokeless tobacco: Never  Vaping Use   Vaping Use: Never used  Substance Use Topics   Alcohol use: Not Currently   Drug use: Never     Allergies   Pollen extract   Review of Systems  Review of Systems  Constitutional:  Negative for chills and fever.  Eyes:  Negative for discharge and redness.  Gastrointestinal:  Negative for nausea and vomiting.  Musculoskeletal:  Positive for arthralgias and myalgias.  Neurological:  Negative for numbness.     Physical Exam Triage Vital Signs ED Triage Vitals  Enc Vitals Group     BP 01/01/22 1113 123/89     Pulse Rate 01/01/22 1113 68     Resp 01/01/22 1113 18     Temp 01/01/22 1113 98.1 F (36.7 C)     Temp Source 01/01/22 1113 Oral     SpO2 01/01/22 1113 98 %     Weight --      Height --      Head Circumference --      Peak Flow --      Pain Score 01/01/22 1111 8     Pain Loc --      Pain Edu? --      Excl. in [place]? --    No data found.  Updated Vital Signs BP 123/89 (BP Location: Left Arm)   Pulse 68   Temp 98.1 F (36.7 C) (Oral)   Resp 18   LMP 12/11/2021   SpO2 98%   Breastfeeding No      Physical Exam Vitals and nursing note reviewed.  Constitutional:      General: She is not in acute distress.     Appearance: Normal appearance. She is not ill-appearing.  HENT:     Head: Normocephalic and atraumatic.  Eyes:     Conjunctiva/sclera: Conjunctivae normal.  Cardiovascular:     Rate and Rhythm: Normal rate.  Pulmonary:     Effort: Pulmonary effort is normal.  Musculoskeletal:     Comments: Full ROM of left shoulder, TTP noted diffusely to medial upper left trapezius  Neurological:     Mental Status: She is alert.  Psychiatric:        Mood and Affect: Mood normal.        Behavior: Behavior normal.        Thought Content: Thought content normal.      UC Treatments / Results  Labs (all labs ordered are listed, but only abnormal results are displayed) Labs Reviewed - No data to display  EKG   Radiology No results found.  Procedures Procedures (including critical care time)  Medications Ordered in UC Medications - No data to display  Initial Impression / Assessment and Plan / UC Course  I have reviewed the triage vital signs and the nursing notes.  Pertinent labs & imaging results that were available during my care of the patient were reviewed by me and considered in my medical decision making (see chart for details).    Suspect muscular strain vs other inflammatory joint issue given presentation. Will trial steroid and muscle relaxer and advised follow up with ortho in the event that symptoms do not improve over the next week or so with treatment. Patient expresses understanding.   Final Clinical Impressions(s) / UC Diagnoses   Final diagnoses:  Acute pain of left shoulder   Discharge Instructions   None    ED Prescriptions     Medication Sig Dispense Auth. Provider   predniSONE (DELTASONE) 20 MG tablet Take 2 tablets (40 mg total) by mouth daily with breakfast for 5 days. 10 tablet Ewell Poe F, PA-C   tiZANidine (ZANAFLEX) 4 MG tablet Take 1 tablet (4 mg total) by mouth every 6 (six) hours as  needed for muscle spasms. 30 tablet Francene Finders, PA-C       PDMP not reviewed this encounter.   Francene Finders, PA-C 01/01/22 1445

## 2022-01-15 DIAGNOSIS — M542 Cervicalgia: Secondary | ICD-10-CM | POA: Diagnosis not present

## 2022-01-15 DIAGNOSIS — M25512 Pain in left shoulder: Secondary | ICD-10-CM | POA: Diagnosis not present

## 2022-02-05 DIAGNOSIS — M542 Cervicalgia: Secondary | ICD-10-CM | POA: Diagnosis not present

## 2022-02-08 ENCOUNTER — Ambulatory Visit (INDEPENDENT_AMBULATORY_CARE_PROVIDER_SITE_OTHER): Payer: BC Managed Care – PPO | Admitting: Sports Medicine

## 2022-02-08 DIAGNOSIS — M25512 Pain in left shoulder: Secondary | ICD-10-CM

## 2022-02-08 DIAGNOSIS — G8929 Other chronic pain: Secondary | ICD-10-CM | POA: Diagnosis not present

## 2022-02-08 DIAGNOSIS — M6289 Other specified disorders of muscle: Secondary | ICD-10-CM

## 2022-02-08 DIAGNOSIS — M62838 Other muscle spasm: Secondary | ICD-10-CM

## 2022-02-08 MED ORDER — METHYLPREDNISOLONE 4 MG PO TBPK: ORAL_TABLET | ORAL | 0 refills | Status: DC

## 2022-02-08 MED ORDER — LIDOCAINE HCL 1 % IJ SOLN
2.0000 mL | INTRAMUSCULAR | Status: AC | PRN
  Administered 2022-02-08: 2 mL

## 2022-02-08 MED ORDER — METHYLPREDNISOLONE ACETATE 40 MG/ML IJ SUSP
40.0000 mg | INTRAMUSCULAR | Status: AC | PRN
  Administered 2022-02-08: 40 mg via INTRAMUSCULAR

## 2022-02-08 NOTE — Progress Notes (Signed)
Went to ED on 01/01/22.  Followed up with Raliegh Ip on 01/15/22 and had X-rays done  She states she does have numbness into hand  Has tried all methods of treatments;  Medicine by mouth, IM injections- no relief Pain for about 4 months  Here today for possible injection into shoulder  Pain is constant, especially when not using it..it will get stiff and sore

## 2022-02-08 NOTE — Progress Notes (Signed)
Kim Mason - 31 y.o. female MRN [id number]  Date of birth: 1990-03-15  Office Visit Note: Visit Date: 02/08/2022 PCP: Bernerd Limbo, MD Referred by: Bernerd Limbo, MD  Subjective: Chief Complaint  Patient presents with   Left Shoulder - Pain   HPI: Kim Mason is a pleasant 31 y.o. female who presents today for left trapezius and shoulder pain.  She has been dealing with left shoulder and trapezius pain for the last 4-5 months.  No specific injury, although she does state that time started when she was having to repetitively reach over into her daughter's bassinet to pick up her daughter.  She also carries her on the left arm most of the time. She was seen at [facility] and was given an IM injection of Kenalog and Toradol.  Reviewed note from Dr. Mardelle Matte from 01/15/2022. She also has been taking meloxicam.  She states she is unsure if the meloxicam is helping.  Prior to her orthopedic referral she was seen in the emergency department and did get a prescription of prednisone which she states helped her the most.  Feels tight and heavy in the trap.  Occasionally she will get some numbness and tingling into the thumb and index finger, although not consistent.  She has full range of motion.  Pain is worse at nighttime.  Patient presents today because she was considering injection at some point as her pain has recently been exacerbated with the activity she has to do with work and as a mother.  This is causing her pain with daily activities.  She reports she does have an MRI of the left shoulder upcoming this Friday, 02/12/22.  Pertinent ROS were reviewed with the patient and found to be negative unless otherwise specified above in HPI.   Assessment & Plan: Visit Diagnoses:  1. Chronic left shoulder pain   2. Trapezius muscle spasm   3. Muscle hypertonicity   4. Trigger point of left shoulder region    Plan: Discussed with [name] the nature of her shoulder and trapezius pain.  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  She does have notable hypertonicity of the left trapezius was associated trigger points.  She does get intermittent numbness and tingling in the thumb and index finger, although I am less suspicious of this coming from the neck.  Through shared decision making, elected to proceed with trigger point injections, she tolerated this well.  Her only previous good relief was from a prednisone course, we will repeat this once with a Medrol Dosepak.  Did print out and reviewed home rehab exercises for the trap/neck and the shoulder that was demonstrated by my athletic trainer today.  She may still continue or MRI of the shoulder and we will follow-up in about 3-4 weeks if not improving.  Did discuss with her lifestyle modifications such as how she holds her child, alternating hands and trying caring on the right side if possible.  I did independently review her previous shoulder and neck x-rays which do not show any bony changes or arthritic changes that would be causing her pain.  Follow-up: Return for F/u in 3-4 weeks if the shoulder is not improving .   Meds & Orders:  Meds ordered this encounter  Medications   methylPREDNISolone (MEDROL DOSEPAK) 4 MG TBPK tablet    Sig: Take per packet instructions.    Dispense:  1 each  Refill:  0    Orders Placed This Encounter  Procedures   Trigger Point Inj     Procedures: Trigger Point Inj  Date/Time: 02/08/2022 10:03 AM  Performed by: Elba Barman, DO Authorized by: Elba Barman, DO   Consent Given by:  Patient Site marked: the procedure site was marked   Timeout: prior to procedure the correct patient, procedure, and site was verified   Indications:  Pain and therapeutic Total # of Trigger Points:  2 Location: shoulder   Needle Size:  25 G Approach:  Dorsal Medications #1:  2 mL lidocaine 1 % Medications #2:  40 mg methylPREDNISolone acetate 40  MG/ML Patient tolerance:  Patient tolerated the procedure well with no immediate complications Comments: Procedure:Trigger point injection, left trapezius After discussion on R/B/I and informed verbal consent was obtained, a timeout was conducted. The patient was placed in a prone position on the examination table and the area of maximal tenderness was identified over 2 areas of the left trapezius musculature.  This area was cleansed with Betadine and multiple alcohol swabs. Ethyl chloride was used for local anesthesia. Using a 25-gauge 1.5 inch needle the trigger point(s) was subsequently injected with a mixture of 1 cc of methylprednisolone 40 mg/mL and 2 cc of 1% lidocaine without epinephrine, with a total of 1.5 cc of injectate into each trigger point. A band-aid was applied following. Patient tolerated procedure well, there were no post-injection complications. Post-procedure instructions were given.         Clinical History: No specialty comments available.  She reports that she has never smoked. She has never used smokeless tobacco. No results for input(s): "HGBA1C", "LABURIC" in the last 8760 hours.  Objective:    Physical Exam  Gen: Well-appearing, in no acute distress; non-toxic CV: Regular Rate. Well-perfused. Warm.  Resp: Breathing unlabored on room air; no wheezing. Psych: Fluid speech in conversation; appropriate affect; normal thought process Neuro: Sensation intact throughout. No gross coordination deficits.   Ortho Exam - Left shoulder/trapezius: There is increased musculature of the left trapezius compared to the right.  There is some very mild soft tissue swelling.  Positive trigger point in the left trapezius and just superior to the scapula.  No redness.  There is full range of motion about the shoulder in all directions.  Rotator cuff testing intact.  5/5 strength in all directions. NVI.  No abnormal scapular dyskinesis noted.  - Neck: Full active and passive range of  motion in flexion and extension of the neck.  No midline spinous process TTP.  Negative Spurling's test.  Imaging:  CT ANGIO NECK W OR WO CONTRAST CLINICAL DATA:  31 year old female with family history of aneurysm/tumor. Postpartum in April. Vision changes.  EXAM: CT ANGIOGRAPHY HEAD AND NECK  TECHNIQUE: Multidetector CT imaging of the head and neck was performed using the standard protocol during bolus administration of intravenous contrast. Multiplanar CT image reconstructions and MIPs were obtained to evaluate the vascular anatomy. Carotid stenosis measurements (when applicable) are obtained utilizing NASCET criteria, using the distal internal carotid diameter as the denominator.  RADIATION DOSE REDUCTION: This exam was performed according to the departmental dose-optimization program which includes automated exposure control, adjustment of the mA and/or kV according to patient size and/or use of iterative reconstruction technique.  CONTRAST:  45m OMNIPAQUE IOHEXOL 350 MG/ML SOLN  COMPARISON:  None Available.  FINDINGS: CT HEAD  Brain: Normal cerebral volume. No midline shift, ventriculomegaly, mass effect, evidence of mass lesion, intracranial hemorrhage or evidence of  cortically based acute infarction. Gray-white matter differentiation is within normal limits throughout the brain.  Calvarium and skull base: Negative.  Paranasal sinuses: Tympanic cavities, Visualized paranasal sinuses and mastoids are clear.  Orbits: Visualized orbits and scalp soft tissues are within normal limits.  CTA NECK  Skeleton: Mild posterior cervical disc bulging and endplate spurring C4 through C6. No acute osseous abnormality identified.  Upper chest: Negative.  Other neck: Coarsely calcified 10 mm right thyroid nodule (series 10, image 141), Recommend thyroid US (ref: J Am Coll Radiol. 2015 Feb;12(2): 143-50).  Otherwise negative visible neck soft tissues.  Aortic arch: 3  vessel arch configuration.  No atherosclerosis.  Right carotid system: Negative.  Left carotid system: Negative.  Vertebral arteries: Negative.  Codominant vertebral arteries.  CTA HEAD  Posterior circulation: Mildly tortuous codominant distal vertebral arteries with no the 4 segment or vertebrobasilar junction atherosclerosis or stenosis. Normal PICA origins. Patent basilar artery without stenosis. Normal SCA and PCA origins. Posterior communicating arteries are diminutive or absent. Bilateral PCA branches are within normal limits.  Anterior circulation: Both ICA siphons are patent and normal. Patent carotid termini. Normal MCA and ACA origins. Tortuous ACA A1 segments. Normal anterior communicating artery. MCA M1 segments and MCA bifurcations are patent without stenosis. Bilateral MCA and ACA branches are within normal limits.  Venous sinuses: Patent. Right side developmental venous anomaly suspected at or near the operculum (normal variant series 12, image 74).  Anatomic variants: None significant.  Review of the MIP images confirms the above findings  IMPRESSION: 1. Normal CTA head and neck. No intracranial atherosclerosis or aneurysm. 2. A 10 mm calcified Right Thyroid Nodule. Recommend thyroid US (ref: J Am Coll Radiol. 2015 Feb;12(2): 143-50). 3. Normal CT appearance of the Brain.  Electronically Signed   By: Genevie Ann M.D.   On: 08/06/2021 10:10 CT ANGIO HEAD W OR WO CONTRAST CLINICAL DATA:  31 year old female with family history of aneurysm/tumor. Postpartum in April. Vision changes.  EXAM: CT ANGIOGRAPHY HEAD AND NECK  TECHNIQUE: Multidetector CT imaging of the head and neck was performed using the standard protocol during bolus administration of intravenous contrast. Multiplanar CT image reconstructions and MIPs were obtained to evaluate the vascular anatomy. Carotid stenosis measurements (when applicable) are obtained utilizing NASCET criteria, using  the distal internal carotid diameter as the denominator.  RADIATION DOSE REDUCTION: This exam was performed according to the departmental dose-optimization program which includes automated exposure control, adjustment of the mA and/or kV according to patient size and/or use of iterative reconstruction technique.  CONTRAST:  60m OMNIPAQUE IOHEXOL 350 MG/ML SOLN  COMPARISON:  None Available.  FINDINGS: CT HEAD  Brain: Normal cerebral volume. No midline shift, ventriculomegaly, mass effect, evidence of mass lesion, intracranial hemorrhage or evidence of cortically based acute infarction. Gray-white matter differentiation is within normal limits throughout the brain.  Calvarium and skull base: Negative.  Paranasal sinuses: Tympanic cavities, Visualized paranasal sinuses and mastoids are clear.  Orbits: Visualized orbits and scalp soft tissues are within normal limits.  CTA NECK  Skeleton: Mild posterior cervical disc bulging and endplate spurring C4 through C6. No acute osseous abnormality identified.  Upper chest: Negative.  Other neck: Coarsely calcified 10 mm right thyroid nodule (series 10, image 141), Recommend thyroid [place](ref: J Am Coll Radiol. 2015 Feb;12(2): 143-50).  Otherwise negative visible neck soft tissues.  Aortic arch: 3 vessel arch configuration.  No atherosclerosis.  Right carotid system: Negative.  Left carotid system: Negative.  Vertebral arteries: Negative.  Codominant vertebral  arteries.  CTA HEAD  Posterior circulation: Mildly tortuous codominant distal vertebral arteries with no the 4 segment or vertebrobasilar junction atherosclerosis or stenosis. Normal PICA origins. Patent basilar artery without stenosis. Normal SCA and PCA origins. Posterior communicating arteries are diminutive or absent. Bilateral PCA branches are within normal limits.  Anterior circulation: Both ICA siphons are patent and normal. Patent carotid termini. Normal  MCA and ACA origins. Tortuous ACA A1 segments. Normal anterior communicating artery. MCA M1 segments and MCA bifurcations are patent without stenosis. Bilateral MCA and ACA branches are within normal limits.  Venous sinuses: Patent. Right side developmental venous anomaly suspected at or near the operculum (normal variant series 12, image 74).  Anatomic variants: None significant.  Review of the MIP images confirms the above findings  IMPRESSION: 1. Normal CTA head and neck. No intracranial atherosclerosis or aneurysm. 2. A 10 mm calcified Right Thyroid Nodule. Recommend thyroid US (ref: J Am Coll Radiol. 2015 Feb;12(2): 143-50). 3. Normal CT appearance of the Brain.  Electronically Signed   By: Genevie Ann M.D.   On: 08/06/2021 10:10    *Independent review of 2-view cervical x-ray from 01/15/22 at [facility] was independently reviewed by myself.  2 view show very mild loss of cervical lordosis.  No significant cervical DJD or facet arthropathy.  *Independent reivew of 3-view left shoulder x-ray from 01/15/22 at [facility] when it was independently reviewed by myself.  X-rays demonstrate humeral head well-seated within the glenohumeral joint.  There is no AC joint arthritis, no glenohumeral joint arthritis, no soft tissue abnormality.  Past Medical/Family/Surgical/Social History: Medications & Allergies reviewed per EMR, new medications updated. Patient Active Problem List   Diagnosis Date Noted   Normal labor and delivery 06/05/2021   Seasonal and perennial allergic rhinitis 07/27/2016   Seasonal allergic conjunctivitis 07/27/2016   Past Medical History:  Diagnosis Date   Ectopic pregnancy    Herpes    Medical history non-contributory    Family History  Problem Relation Age of Onset   Hypertension Mother    Birth defects Father    Healthy Father    Allergic rhinitis Neg Hx    Angioedema Neg Hx    Asthma Neg Hx    Eczema Neg Hx    Immunodeficiency Neg Hx    Urticaria  Neg Hx    Past Surgical History:  Procedure Laterality Date   NO PAST SURGERIES     Social History   Occupational History   Not on file  Tobacco Use   Smoking status: Never   Smokeless tobacco: Never  Vaping Use   Vaping Use: Never used  Substance and Sexual Activity   Alcohol use: Not Currently   Drug use: Never   Sexual activity: Yes

## 2022-02-12 DIAGNOSIS — M542 Cervicalgia: Secondary | ICD-10-CM | POA: Diagnosis not present

## 2022-03-11 ENCOUNTER — Encounter: Payer: Self-pay | Admitting: Sports Medicine

## 2022-03-11 ENCOUNTER — Ambulatory Visit (INDEPENDENT_AMBULATORY_CARE_PROVIDER_SITE_OTHER): Payer: BC Managed Care – PPO | Admitting: Sports Medicine

## 2022-03-11 DIAGNOSIS — M5412 Radiculopathy, cervical region: Secondary | ICD-10-CM

## 2022-03-11 DIAGNOSIS — G8929 Other chronic pain: Secondary | ICD-10-CM

## 2022-03-11 DIAGNOSIS — M25512 Pain in left shoulder: Secondary | ICD-10-CM

## 2022-03-11 DIAGNOSIS — M629 Disorder of muscle, unspecified: Secondary | ICD-10-CM

## 2022-03-11 DIAGNOSIS — M9901 Segmental and somatic dysfunction of cervical region: Secondary | ICD-10-CM

## 2022-03-11 DIAGNOSIS — M9902 Segmental and somatic dysfunction of thoracic region: Secondary | ICD-10-CM

## 2022-03-11 DIAGNOSIS — R590 Localized enlarged lymph nodes: Secondary | ICD-10-CM

## 2022-03-11 NOTE — Progress Notes (Signed)
Kim Mason - 32 y.o. female MRN [id number]  Date of birth: 12-25-1990  Office Visit Note: Visit Date: 03/11/2022 PCP: Bernerd Limbo, MD Referred by: Bernerd Limbo, MD  Subjective: Chief Complaint  Patient presents with   Left Shoulder - Pain   HPI: Kim Mason is a pleasant 31 y.o. female who presents today for follow-up of left shoulder and neck pain.  Last saw the patient on 02/08/2022.  She did have very hypertonic trapezius musculature and we did perform trigger point injections which gave her good relief.  She is working on activity modification such as holding her daughter and opposite arm.  She had an MRI of the cervical spine on 02/12/2022 which is ordered by an outside provider.  She is hoping to review this today.  She also noticed a few small lumps/knots on the left side of her neck that popped up over the last week or so.  In conversation with the patient, she did have a URI/viral illness around the holiday.  She is feeling better now.  Pertinent ROS were reviewed with the patient and found to be negative unless otherwise specified above in HPI.   Assessment & Plan: Visit Diagnoses:  1. Segmental and somatic dysfunction of cervical region   2. Segmental and somatic dysfunction of thoracic region   3. Chronic left shoulder pain   4. Radiculopathy, cervical region   5. Cervical lymphadenopathy    *2+ chronic conditions, independent interpretation of test (MRI) ordered by someone else  Plan: Discussed with Kim Mason that she still has some somatic dysfunction of the left shoulder and neck, however this was improved with trigger point injections in the home exercises and activity modifications she has been doing at home.  Recommended continuing home exercises, working on postural changes as well.  Discussed all treatment options such as oral medication therapy, home therapy, PT, osteopathic manipulation.  After shared decision-making, elected to proceed with OMT to the neck  and upper thoracic which she responded well to.  She may continue to take ibuprofen over-the-counter anti-inflammatories for pain or any inflammation.  We did review her MRI in the room today, most of this is rather benign that will get better with time and home exercises.  She does have some encroachment upon the left C6 nerve which I do think feels contributing to her numbness tingling in the left thumb and index finger.  This is not too bothersome for her.  Recommended over-the-counter anti-inflammatories, if this does not get better with home exercises and time she will let me know and we can consider gabapentin or other pharmacologic options (NSAIDs, prednisone, etc.).  She will follow-up with me as needed.  Follow-up: Return if symptoms worsen or fail to improve.   Meds & Orders: No orders of the defined types were placed in this encounter.  No orders of the defined types were placed in this encounter.    Procedures: No procedures performed      - Osteopathic Treatment performed: Suboccipital release, myofascial release of the left trapezius, paraspinal muscles of the upper thoracic; muscle energy of the cervical spine.  HVLA of the cervical spine, HVLA of the thoracic spine.  Clinical History: No specialty comments available.  She reports that she has never smoked. She has never used smokeless tobacco. No results for input(s): "HGBA1C", "LABURIC" in the last 8760 hours.  Objective:   Vital Signs: There were no vitals taken for this visit.  Physical Exam  Gen: Well-appearing, in no acute  distress; non-toxic CV: Regular Rate. Well-perfused. Warm.  Resp: Breathing unlabored on room air; no wheezing. Psych: Fluid speech in conversation; appropriate affect; normal thought process Neuro: Sensation intact throughout. No gross coordination deficits.   Ortho Exam - Cervical spine: There is mild flattening of the cervical lordosis.  There are 2 well-circumscribed, mobile enlarged nodules  likely lymph nodes of the left anterior cervical chain.  Motion is flexion and extension.  Negative Spurling's maneuver.  2/4 DTR about the brachioradialis, biceps and triceps testing.  5/5 strength of the upper extremities.  - Left shoulder/trapezius: + Hypertonicity of left trapezius.  The left shoulder joint moves in all directions with full strength and range of motion.  - Osteopathic exam:   *lower cervical paraspinal hypertonicity   *C6 - flexed, Rot-L, SB-L   *Paraspinal hypertonicity and dysfunction of upper cervical and lower thoracic musculature, L > R; trapezius hypertonicity    *T2-T4 - Flex, Rot-R, SB-L    *T5 - Ext, Rot-R, SB-R  Imaging:  *Independent review of the cervical spine MRI without contrast on 02/12/2022 was reviewed and interpreted by myself.  There is some flattening of the normal cervical lordosis.  There are very small right paracentral disc protrusions at C4-C5 and see 6-C7.  There is no evidence of herniation of the disc.  There is mild to moderate left C6 foraminal stenosis with some joint hypertrophy/spurring noted at this region.  This does appear to encroach upon the left C6 nerve, although certainly no gross impingement.   Past Medical/Family/Surgical/Social History: Medications & Allergies reviewed per EMR, new medications updated. Patient Active Problem List   Diagnosis Date Noted   Normal labor and delivery 06/05/2021   Seasonal and perennial allergic rhinitis 07/27/2016   Seasonal allergic conjunctivitis 07/27/2016   Past Medical History:  Diagnosis Date   Ectopic pregnancy    Herpes    Medical history non-contributory    Family History  Problem Relation Age of Onset   Hypertension Mother    Birth defects Father    Healthy Father    Allergic rhinitis Neg Hx    Angioedema Neg Hx    Asthma Neg Hx    Eczema Neg Hx    Immunodeficiency Neg Hx    Urticaria Neg Hx    Past Surgical History:  Procedure Laterality Date   NO PAST SURGERIES      Social History   Occupational History   Not on file  Tobacco Use   Smoking status: Never   Smokeless tobacco: Never  Vaping Use   Vaping Use: Never used  Substance and Sexual Activity   Alcohol use: Not Currently   Drug use: Never   Sexual activity: Yes

## 2022-03-11 NOTE — Progress Notes (Signed)
States she is doing better with pain in shoulder Her main concern were "knots" that appeared at her neck/ shoulder  She states there are four total; semi painful

## 2022-03-18 DIAGNOSIS — Z3202 Encounter for pregnancy test, result negative: Secondary | ICD-10-CM | POA: Diagnosis not present

## 2022-03-18 DIAGNOSIS — N939 Abnormal uterine and vaginal bleeding, unspecified: Secondary | ICD-10-CM | POA: Diagnosis not present

## 2022-04-28 DIAGNOSIS — Z0000 Encounter for general adult medical examination without abnormal findings: Secondary | ICD-10-CM | POA: Diagnosis not present

## 2022-04-28 DIAGNOSIS — Z7689 Persons encountering health services in other specified circumstances: Secondary | ICD-10-CM | POA: Diagnosis not present

## 2022-04-28 DIAGNOSIS — N912 Amenorrhea, unspecified: Secondary | ICD-10-CM | POA: Diagnosis not present

## 2022-07-16 DIAGNOSIS — Z01419 Encounter for gynecological examination (general) (routine) without abnormal findings: Secondary | ICD-10-CM | POA: Diagnosis not present

## 2022-07-16 DIAGNOSIS — Z124 Encounter for screening for malignant neoplasm of cervix: Secondary | ICD-10-CM | POA: Diagnosis not present

## 2022-08-27 IMAGING — CT CT ANGIO NECK
2 of 11 series · 7 of 33 positions shown · non-contrast
Comparison: None Available.

CLINICAL DATA: 30-year-old female with family history of
aneurysm/tumor. Postpartum in [place]. Vision changes.

EXAM:
CT ANGIOGRAPHY HEAD AND NECK
TECHNIQUE: Multidetector CT imaging of the head and neck was performed using
the standard protocol during bolus administration of intravenous
contrast. Multiplanar CT image reconstructions and MIPs were
obtained to evaluate the vascular anatomy. Carotid stenosis
measurements (when applicable) are obtained utilizing NASCET
criteria, using the distal internal carotid diameter as the
denominator.

[Series 10: cta head neck · axial · 0.65mm/px · z∈[+1256,+1378]mm · 2 of 185 slices shown]
[im 62/185  soft-tissue]
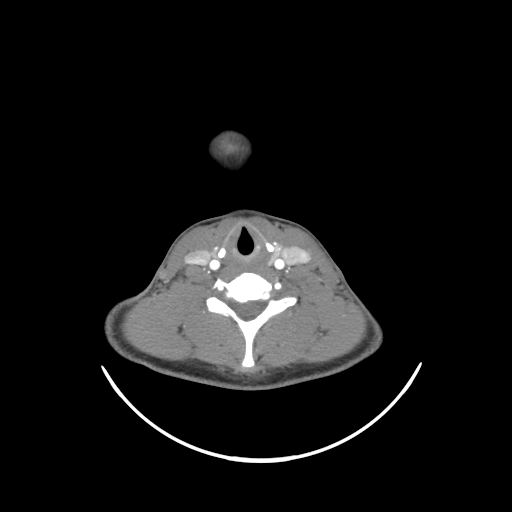
[im 123/185  soft-tissue]
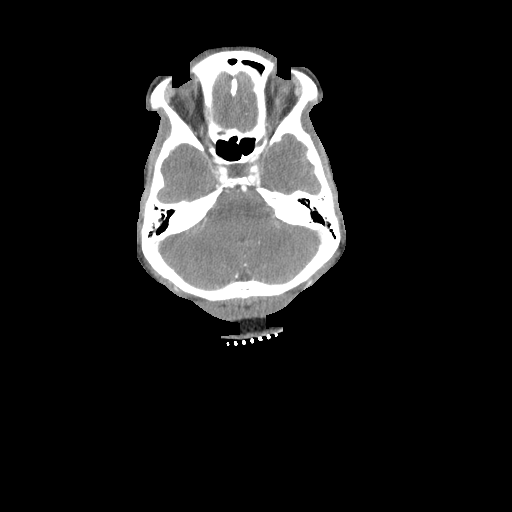

[Series 12: ax thin · axial · 0.69mm/px · z∈[+1195,+1441]mm · 5 of 370 slices shown]
[im 62/370  soft-tissue]
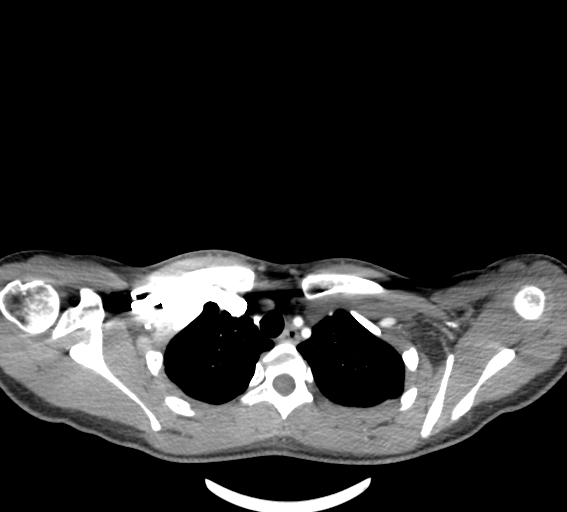
[im 124/370  bone]
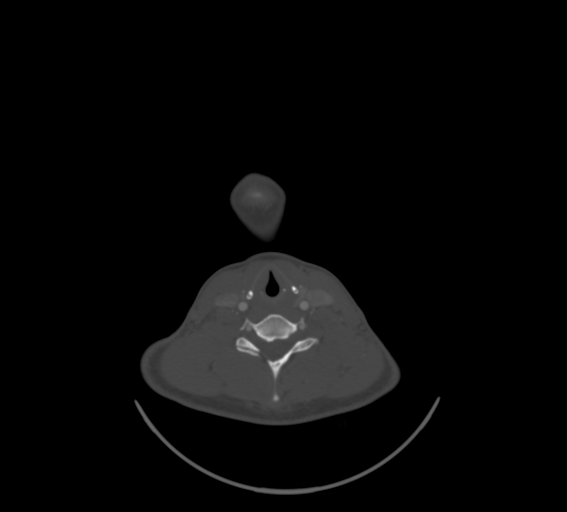
[im 185/370  soft-tissue]
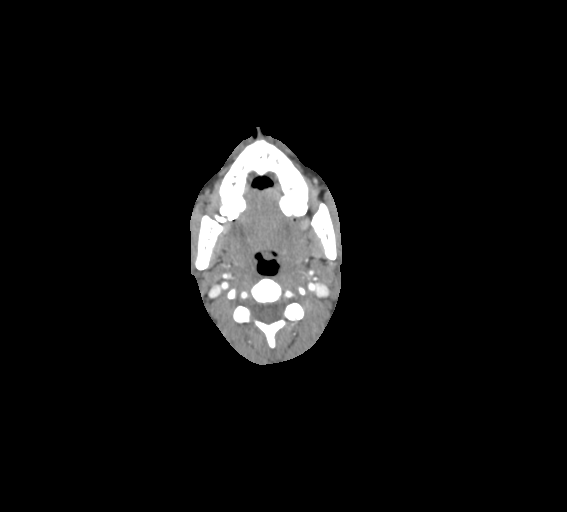
[im 247/370  bone]
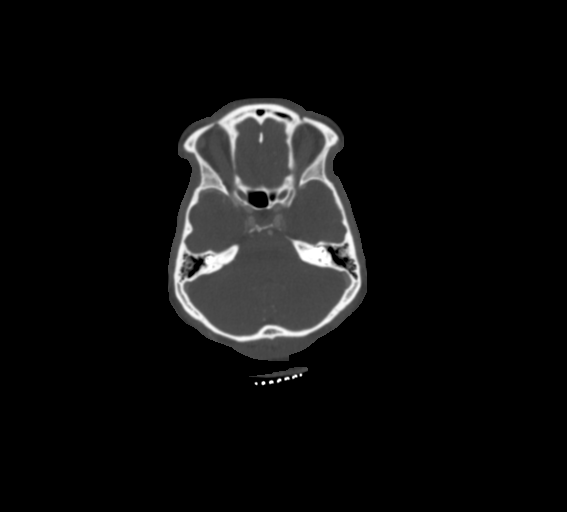
[im 308/370  soft-tissue]
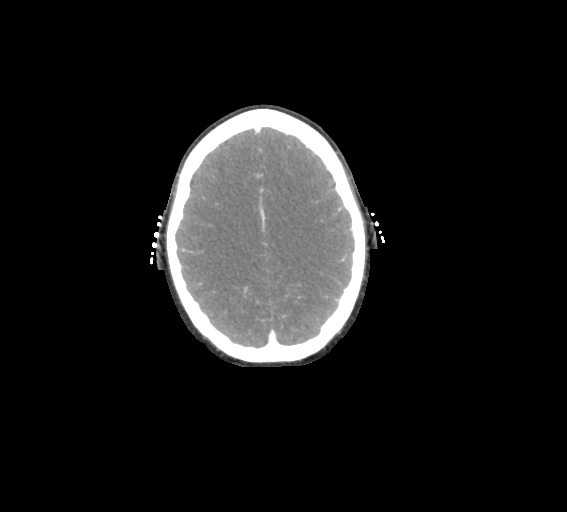

[7 of 33 positions shown; findings below may reference images not displayed]

RADIATION DOSE REDUCTION: This exam was performed according to the
departmental dose-optimization program which includes automated
exposure control, adjustment of the mA and/or kV according to
patient size and/or use of iterative reconstruction technique.

CONTRAST:  75mL OMNIPAQUE IOHEXOL 350 MG/ML SOLN
FINDINGS: CT HEAD

Brain: Normal cerebral volume. No midline shift, ventriculomegaly,
mass effect, evidence of mass lesion, intracranial hemorrhage or
evidence of cortically based acute infarction. Gray-white matter
differentiation is within normal limits throughout the brain.

Calvarium and skull base: Negative.

Paranasal sinuses: Tympanic cavities, Visualized paranasal sinuses
and mastoids are clear.

Orbits: Visualized orbits and scalp soft tissues are within normal
limits.

CTA NECK

Skeleton: Mild posterior cervical disc bulging and endplate spurring
C4 through C6. No acute osseous abnormality identified.

Upper chest: Negative.

Other neck: Coarsely calcified 10 mm right thyroid nodule (series
10, image 141), Recommend thyroid US (ref: [HOSPITAL]. [DATE]): 143-50).

Otherwise negative visible neck soft tissues.

Aortic arch: 3 vessel arch configuration.  No atherosclerosis.

Right carotid system: Negative.

Left carotid system: Negative.

Vertebral arteries:
Negative.  Codominant vertebral arteries.

CTA HEAD

Posterior circulation: Mildly tortuous codominant distal vertebral
arteries with no the 4 segment or vertebrobasilar junction
atherosclerosis or stenosis. Normal PICA origins. Patent basilar
artery without stenosis. Normal SCA and PCA origins. Posterior
communicating arteries are diminutive or absent. Bilateral PCA
branches are within normal limits.

Anterior circulation: Both ICA siphons are patent and normal. Patent
carotid termini. Normal MCA and ACA origins. Tortuous ACA A1
segments. Normal anterior communicating artery. MCA M1 segments and
MCA bifurcations are patent without stenosis. Bilateral MCA and ACA
branches are within normal limits.

Venous sinuses: Patent. Right side developmental venous anomaly
suspected at or near the operculum (normal variant series 12, image
74).

Anatomic variants: None significant.

Review of the MIP images confirms the above findings
IMPRESSION: 1. Normal CTA head and neck. No intracranial atherosclerosis or
aneurysm.
2. A 10 mm calcified Right Thyroid Nodule. Recommend thyroid US
(ref: [HOSPITAL]. [DATE]): 143-50).
3. Normal CT appearance of the Brain.

## 2022-09-21 DIAGNOSIS — Z30011 Encounter for initial prescription of contraceptive pills: Secondary | ICD-10-CM | POA: Diagnosis not present

## 2023-02-16 DIAGNOSIS — Z3490 Encounter for supervision of normal pregnancy, unspecified, unspecified trimester: Secondary | ICD-10-CM | POA: Diagnosis not present

## 2023-02-16 DIAGNOSIS — O3680X0 Pregnancy with inconclusive fetal viability, not applicable or unspecified: Secondary | ICD-10-CM | POA: Diagnosis not present

## 2023-02-18 DIAGNOSIS — O3680X0 Pregnancy with inconclusive fetal viability, not applicable or unspecified: Secondary | ICD-10-CM | POA: Diagnosis not present

## 2023-02-21 DIAGNOSIS — O0281 Inappropriate change in quantitative human chorionic gonadotropin (hCG) in early pregnancy: Secondary | ICD-10-CM | POA: Diagnosis not present

## 2023-02-21 DIAGNOSIS — O3680X0 Pregnancy with inconclusive fetal viability, not applicable or unspecified: Secondary | ICD-10-CM | POA: Diagnosis not present

## 2023-02-23 DIAGNOSIS — O3680X0 Pregnancy with inconclusive fetal viability, not applicable or unspecified: Secondary | ICD-10-CM | POA: Diagnosis not present

## 2023-02-23 DIAGNOSIS — O0281 Inappropriate change in quantitative human chorionic gonadotropin (hCG) in early pregnancy: Secondary | ICD-10-CM | POA: Diagnosis not present

## 2023-02-25 ENCOUNTER — Inpatient Hospital Stay (HOSPITAL_COMMUNITY)
Admission: AD | Admit: 2023-02-25 | Discharge: 2023-02-25 | Disposition: A | Discharge status: Home / Self Care | Payer: BC Managed Care – PPO | Attending: Obstetrics and Gynecology | Admitting: Obstetrics and Gynecology

## 2023-02-25 ENCOUNTER — Inpatient Hospital Stay (HOSPITAL_COMMUNITY): Payer: BC Managed Care – PPO

## 2023-02-25 ENCOUNTER — Encounter (HOSPITAL_COMMUNITY): Payer: Self-pay | Admitting: Obstetrics and Gynecology

## 2023-02-25 DIAGNOSIS — O0289 Other abnormal products of conception: Secondary | ICD-10-CM | POA: Insufficient documentation

## 2023-02-25 DIAGNOSIS — O3680X0 Pregnancy with inconclusive fetal viability, not applicable or unspecified: Secondary | ICD-10-CM | POA: Diagnosis not present

## 2023-02-25 DIAGNOSIS — R109 Unspecified abdominal pain: Secondary | ICD-10-CM | POA: Diagnosis not present

## 2023-02-25 DIAGNOSIS — O0281 Inappropriate change in quantitative human chorionic gonadotropin (hCG) in early pregnancy: Secondary | ICD-10-CM | POA: Diagnosis not present

## 2023-02-25 DIAGNOSIS — O26891 Other specified pregnancy related conditions, first trimester: Secondary | ICD-10-CM | POA: Diagnosis not present

## 2023-02-25 DIAGNOSIS — Z3A01 Less than 8 weeks gestation of pregnancy: Secondary | ICD-10-CM | POA: Diagnosis not present

## 2023-02-25 DIAGNOSIS — O009 Unspecified ectopic pregnancy without intrauterine pregnancy: Secondary | ICD-10-CM | POA: Diagnosis not present

## 2023-02-25 DIAGNOSIS — O209 Hemorrhage in early pregnancy, unspecified: Secondary | ICD-10-CM | POA: Diagnosis not present

## 2023-02-25 LAB — CBC WITH DIFFERENTIAL/PLATELET
Abs Immature Granulocytes: 0.02 10*3/uL (ref 0.00–0.07)
Basophils Absolute: 0 10*3/uL (ref 0.0–0.1)
Basophils Relative: 0 %
Eosinophils Absolute: 0.1 10*3/uL (ref 0.0–0.5)
Eosinophils Relative: 2 %
HCT: 38.2 % (ref 36.0–46.0)
Hemoglobin: 13.1 g/dL (ref 12.0–15.0)
Immature Granulocytes: 0 %
Lymphocytes Relative: 36 %
Lymphs Abs: 2.9 10*3/uL (ref 0.7–4.0)
MCH: 32 pg (ref 26.0–34.0)
MCHC: 34.3 g/dL (ref 30.0–36.0)
MCV: 93.2 fL (ref 80.0–100.0)
Monocytes Absolute: 0.5 10*3/uL (ref 0.1–1.0)
Monocytes Relative: 6 %
Neutro Abs: 4.6 10*3/uL (ref 1.7–7.7)
Neutrophils Relative %: 56 %
Platelets: 279 10*3/uL (ref 150–400)
RBC: 4.1 MIL/uL (ref 3.87–5.11)
RDW: 11.9 % (ref 11.5–15.5)
WBC: 8.1 10*3/uL (ref 4.0–10.5)
nRBC: 0 % (ref 0.0–0.2)

## 2023-02-25 LAB — COMPREHENSIVE METABOLIC PANEL
ALT: 13 U/L (ref 0–44)
AST: 20 U/L (ref 15–41)
Albumin: 3.9 g/dL (ref 3.5–5.0)
Alkaline Phosphatase: 37 U/L — ABNORMAL LOW (ref 38–126)
Anion gap: 10 (ref 5–15)
BUN: 8 mg/dL (ref 6–20)
CO2: 25 mmol/L (ref 22–32)
Calcium: 9.1 mg/dL (ref 8.9–10.3)
Chloride: 103 mmol/L (ref 98–111)
Creatinine, Ser: 0.73 mg/dL (ref 0.44–1.00)
GFR, Estimated: >60 mL/min (ref >60)
Glucose, Bld: 91 mg/dL (ref 70–99)
Potassium: 3.9 mmol/L (ref 3.5–5.1)
Sodium: 138 mmol/L (ref 135–145)
Total Bilirubin: 0.6 mg/dL (ref <1.2)
Total Protein: 7.1 g/dL (ref 6.5–8.1)

## 2023-02-25 MED ORDER — METHOTREXATE FOR ECTOPIC PREGNANCY
50.0000 mg/m2 | Freq: Once | INTRAMUSCULAR | Status: AC
  Administered 2023-02-25: 87.5 mg via INTRAMUSCULAR
  Filled 2023-02-25: qty 3.5

## 2023-02-25 NOTE — Discharge Instructions (Signed)
Keep scheduled appointment at [facility] Tuesday 03/01/23 for follow up.

## 2023-02-25 NOTE — MAU Note (Signed)
Kim Mason is a 32 y.o. at Unknown here in MAU reporting: dr thought she might have an ectopic preg.  Started bleeding on Tues., light in amt.   HCG levels went up, down and then back up again. Moderate cramps.  LMP: 11/12 Onset of complaint: Tues Pain score: moderate Vitals:   02/25/23 1652  BP: 121/74  Pulse: 96  Resp: 16  Temp: 98.5 F (36.9 C)  SpO2: 100%      Lab orders placed from triage:

## 2023-02-25 NOTE — MAU Provider Note (Signed)
None     S Ms. CHRISS JANISH is a 32 y.o. [phone] pregnant/non-pregnant female at Unknown who presents to MAU today with from Dr. Connye Burkitt office. Per Dr. Connye Burkitt communication, this patient has had serial BetaHcg quants with the following abnormal readings: 12/11: 83, 12/13: 131, 12/16: 110, 12/18: 57, 12/20: 101. Patient has not yet had imaging, and no ability to scan patient in office today. Patient was sent for imaging to determine ectopic vs. IUP due to abnormal rise.   Receives care at [facility]. Prenatal records not available.  Pertinent items noted in HPI and remainder of comprehensive ROS otherwise negative.   O BP 121/74 (BP Location: Right Arm)   Pulse 96   Temp 98.5 F (36.9 C) (Oral)   Resp 16   Ht 5\' 7"  (1.702 m)   Wt 64.1 kg   LMP 01/18/2023   SpO2 100%   BMI 22.15 kg/m  Physical Exam Vitals reviewed.  Constitutional:      General: She is not in acute distress.    Appearance: Normal appearance. She is not ill-appearing, toxic-appearing or diaphoretic.  HENT:     Head: Normocephalic.  Cardiovascular:     Rate and Rhythm: Normal rate.     Pulses: Normal pulses.     Heart sounds: Normal heart sounds.  Pulmonary:     Effort: Pulmonary effort is normal.  Skin:    General: Skin is warm and dry.     Capillary Refill: Capillary refill takes less than 2 seconds.  Neurological:     Mental Status: She is alert and oriented to person, place, and time.  Psychiatric:        Mood and Affect: Mood normal.        Behavior: Behavior normal.        Thought Content: Thought content normal.        Judgment: Judgment normal.      MDM: External prenatal records reviewed. [place] ordered and reviewed here. Consulted Dr. Donavan Foil and Dr. Connye Burkitt  MAU Course:  A Nonviable ectopic pregnancy based on serial abnormal BetaHcG readings and US findings.  Medical screening exam complete  P Consulted Dr. Connye Burkitt, who was able to provide outside records of serial betaHcgs.  Consulted Dr.  Donavan Foil, attending OB to verify methotrexate candidate. Dr. Donavan Foil agrees that patient good candidate. Patient agrees to plan of care.  Methotrexate administered as ordered by RN staff.  Discharge from MAU in stable condition with bleeding precautions Follow up at [facility] as discussed with Dr. Connye Burkitt 03/01/23. Patient aware and agrees to follow up care.    Allergies as of 02/25/2023       Reactions   Pollen Extract Itching, Swelling        Medication List     STOP taking these medications    methylPREDNISolone 4 MG Tbpk tablet Commonly known as: MEDROL DOSEPAK       TAKE these medications    ibuprofen 600 MG tablet Commonly known as: ADVIL Take 1 tablet (600 mg total) by mouth every 6 (six) hours.   multivitamin-prenatal 27-0.8 MG Tabs tablet Take 1 tablet by mouth daily at 12 noon.   Olopatadine HCl 0.2 % Soln Commonly known as: Pataday Place 1 drop into both eyes daily as needed.   tiZANidine 4 MG tablet Commonly known as: Zanaflex Take 1 tablet (4 mg total) by mouth every 6 (six) hours as needed for muscle spasms.   valACYclovir 500 MG tablet Commonly known as: VALTREX Take 500 mg  by mouth 2 (two) times daily.        Leafy Half 02/25/2023 8:43 PM

## 2023-03-01 DIAGNOSIS — O283 Abnormal ultrasonic finding on antenatal screening of mother: Secondary | ICD-10-CM | POA: Diagnosis not present

## 2023-03-04 DIAGNOSIS — O3680X0 Pregnancy with inconclusive fetal viability, not applicable or unspecified: Secondary | ICD-10-CM | POA: Diagnosis not present

## 2023-03-04 DIAGNOSIS — O0281 Inappropriate change in quantitative human chorionic gonadotropin (hCG) in early pregnancy: Secondary | ICD-10-CM | POA: Diagnosis not present

## 2023-03-07 DIAGNOSIS — O0281 Inappropriate change in quantitative human chorionic gonadotropin (hCG) in early pregnancy: Secondary | ICD-10-CM | POA: Diagnosis not present

## 2023-03-07 DIAGNOSIS — O3680X0 Pregnancy with inconclusive fetal viability, not applicable or unspecified: Secondary | ICD-10-CM | POA: Diagnosis not present

## 2023-03-11 DIAGNOSIS — O0281 Inappropriate change in quantitative human chorionic gonadotropin (hCG) in early pregnancy: Secondary | ICD-10-CM | POA: Diagnosis not present

## 2023-03-11 DIAGNOSIS — O3680X0 Pregnancy with inconclusive fetal viability, not applicable or unspecified: Secondary | ICD-10-CM | POA: Diagnosis not present

## 2023-03-16 DIAGNOSIS — O3680X0 Pregnancy with inconclusive fetal viability, not applicable or unspecified: Secondary | ICD-10-CM | POA: Diagnosis not present

## 2023-03-16 DIAGNOSIS — O0281 Inappropriate change in quantitative human chorionic gonadotropin (hCG) in early pregnancy: Secondary | ICD-10-CM | POA: Diagnosis not present

## 2023-04-05 DIAGNOSIS — O3680X0 Pregnancy with inconclusive fetal viability, not applicable or unspecified: Secondary | ICD-10-CM | POA: Diagnosis not present

## 2023-04-05 DIAGNOSIS — Z8759 Personal history of other complications of pregnancy, childbirth and the puerperium: Secondary | ICD-10-CM | POA: Diagnosis not present

## 2023-05-03 DIAGNOSIS — Z0000 Encounter for general adult medical examination without abnormal findings: Secondary | ICD-10-CM | POA: Diagnosis not present

## 2023-05-03 DIAGNOSIS — Z1331 Encounter for screening for depression: Secondary | ICD-10-CM | POA: Diagnosis not present

## 2023-05-25 DIAGNOSIS — S90822A Blister (nonthermal), left foot, initial encounter: Secondary | ICD-10-CM | POA: Diagnosis not present

## 2023-05-27 DIAGNOSIS — J3089 Other allergic rhinitis: Secondary | ICD-10-CM | POA: Diagnosis not present

## 2023-05-27 DIAGNOSIS — J301 Allergic rhinitis due to pollen: Secondary | ICD-10-CM | POA: Diagnosis not present

## 2023-06-03 DIAGNOSIS — J301 Allergic rhinitis due to pollen: Secondary | ICD-10-CM | POA: Diagnosis not present

## 2023-06-06 DIAGNOSIS — Z3202 Encounter for pregnancy test, result negative: Secondary | ICD-10-CM | POA: Diagnosis not present

## 2023-06-06 DIAGNOSIS — J3081 Allergic rhinitis due to animal (cat) (dog) hair and dander: Secondary | ICD-10-CM | POA: Diagnosis not present

## 2023-06-06 DIAGNOSIS — J3089 Other allergic rhinitis: Secondary | ICD-10-CM | POA: Diagnosis not present

## 2023-06-06 DIAGNOSIS — N926 Irregular menstruation, unspecified: Secondary | ICD-10-CM | POA: Diagnosis not present

## 2023-06-06 DIAGNOSIS — N898 Other specified noninflammatory disorders of vagina: Secondary | ICD-10-CM | POA: Diagnosis not present

## 2023-06-06 DIAGNOSIS — N760 Acute vaginitis: Secondary | ICD-10-CM | POA: Diagnosis not present

## 2023-06-08 DIAGNOSIS — Z3491 Encounter for supervision of normal pregnancy, unspecified, first trimester: Secondary | ICD-10-CM | POA: Diagnosis not present

## 2023-06-10 DIAGNOSIS — O0281 Inappropriate change in quantitative human chorionic gonadotropin (hCG) in early pregnancy: Secondary | ICD-10-CM | POA: Diagnosis not present

## 2023-06-10 DIAGNOSIS — O3680X0 Pregnancy with inconclusive fetal viability, not applicable or unspecified: Secondary | ICD-10-CM | POA: Diagnosis not present

## 2023-06-14 DIAGNOSIS — O3680X0 Pregnancy with inconclusive fetal viability, not applicable or unspecified: Secondary | ICD-10-CM | POA: Diagnosis not present

## 2023-06-14 DIAGNOSIS — O0281 Inappropriate change in quantitative human chorionic gonadotropin (hCG) in early pregnancy: Secondary | ICD-10-CM | POA: Diagnosis not present

## 2023-06-16 DIAGNOSIS — A749 Chlamydial infection, unspecified: Secondary | ICD-10-CM | POA: Diagnosis not present

## 2023-06-16 DIAGNOSIS — B9689 Other specified bacterial agents as the cause of diseases classified elsewhere: Secondary | ICD-10-CM | POA: Diagnosis not present

## 2023-06-16 DIAGNOSIS — O3680X0 Pregnancy with inconclusive fetal viability, not applicable or unspecified: Secondary | ICD-10-CM | POA: Diagnosis not present

## 2023-06-16 DIAGNOSIS — N939 Abnormal uterine and vaginal bleeding, unspecified: Secondary | ICD-10-CM | POA: Diagnosis not present

## 2023-06-16 DIAGNOSIS — O0281 Inappropriate change in quantitative human chorionic gonadotropin (hCG) in early pregnancy: Secondary | ICD-10-CM | POA: Diagnosis not present

## 2023-06-17 DIAGNOSIS — O0281 Inappropriate change in quantitative human chorionic gonadotropin (hCG) in early pregnancy: Secondary | ICD-10-CM | POA: Diagnosis not present

## 2023-06-17 DIAGNOSIS — O3680X0 Pregnancy with inconclusive fetal viability, not applicable or unspecified: Secondary | ICD-10-CM | POA: Diagnosis not present

## 2023-06-19 ENCOUNTER — Encounter (HOSPITAL_COMMUNITY): Payer: Self-pay | Admitting: Family Medicine

## 2023-06-19 ENCOUNTER — Inpatient Hospital Stay (HOSPITAL_COMMUNITY)
Admission: AD | Admit: 2023-06-19 | Discharge: 2023-06-19 | Disposition: A | Discharge status: Home / Self Care | Payer: Self-pay | Attending: Family Medicine | Admitting: Family Medicine

## 2023-06-19 ENCOUNTER — Other Ambulatory Visit: Payer: Self-pay

## 2023-06-19 ENCOUNTER — Inpatient Hospital Stay (HOSPITAL_COMMUNITY): Payer: Self-pay

## 2023-06-19 DIAGNOSIS — R1032 Left lower quadrant pain: Secondary | ICD-10-CM | POA: Diagnosis not present

## 2023-06-19 DIAGNOSIS — O00102 Left tubal pregnancy without intrauterine pregnancy: Secondary | ICD-10-CM | POA: Diagnosis not present

## 2023-06-19 DIAGNOSIS — O09291 Supervision of pregnancy with other poor reproductive or obstetric history, first trimester: Secondary | ICD-10-CM | POA: Diagnosis not present

## 2023-06-19 DIAGNOSIS — Z3A01 Less than 8 weeks gestation of pregnancy: Secondary | ICD-10-CM | POA: Diagnosis not present

## 2023-06-19 DIAGNOSIS — O26891 Other specified pregnancy related conditions, first trimester: Secondary | ICD-10-CM | POA: Diagnosis not present

## 2023-06-19 DIAGNOSIS — O3680X0 Pregnancy with inconclusive fetal viability, not applicable or unspecified: Secondary | ICD-10-CM | POA: Diagnosis not present

## 2023-06-19 LAB — COMPREHENSIVE METABOLIC PANEL WITH GFR
ALT: 29 U/L (ref 0–44)
AST: 29 U/L (ref 15–41)
Albumin: 3.9 g/dL (ref 3.5–5.0)
Alkaline Phosphatase: 31 U/L — ABNORMAL LOW (ref 38–126)
Anion gap: 9 (ref 5–15)
BUN: 9 mg/dL (ref 6–20)
CO2: 24 mmol/L (ref 22–32)
Calcium: 8.7 mg/dL — ABNORMAL LOW (ref 8.9–10.3)
Chloride: 103 mmol/L (ref 98–111)
Creatinine, Ser: 0.68 mg/dL (ref 0.44–1.00)
GFR, Estimated: >60 mL/min (ref >60)
Glucose, Bld: 108 mg/dL — ABNORMAL HIGH (ref 70–99)
Potassium: 3.9 mmol/L (ref 3.5–5.1)
Sodium: 136 mmol/L (ref 135–145)
Total Bilirubin: 0.5 mg/dL (ref 0.0–1.2)
Total Protein: 7.4 g/dL (ref 6.5–8.1)

## 2023-06-19 LAB — CBC
HCT: 36.7 % (ref 36.0–46.0)
Hemoglobin: 12.6 g/dL (ref 12.0–15.0)
MCH: 32 pg (ref 26.0–34.0)
MCHC: 34.3 g/dL (ref 30.0–36.0)
MCV: 93.1 fL (ref 80.0–100.0)
Platelets: 313 10*3/uL (ref 150–400)
RBC: 3.94 MIL/uL (ref 3.87–5.11)
RDW: 12.3 % (ref 11.5–15.5)
WBC: 17.7 10*3/uL — ABNORMAL HIGH (ref 4.0–10.5)
nRBC: 0 % (ref 0.0–0.2)

## 2023-06-19 LAB — HCG, QUANTITATIVE, PREGNANCY: hCG, Beta Chain, Quant, S: 507 m[IU]/mL — ABNORMAL HIGH (ref <5)

## 2023-06-19 MED ORDER — HYDROMORPHONE HCL 1 MG/ML IJ SOLN
1.0000 mg | Freq: Once | INTRAMUSCULAR | Status: AC
  Administered 2023-06-19: 1 mg via INTRAMUSCULAR
  Filled 2023-06-19: qty 1

## 2023-06-19 MED ORDER — PROMETHAZINE HCL 25 MG/ML IJ SOLN
12.5000 mg | Freq: Once | INTRAMUSCULAR | Status: AC
  Administered 2023-06-19: 12.5 mg via INTRAMUSCULAR
  Filled 2023-06-19: qty 1

## 2023-06-19 MED ORDER — METHOTREXATE FOR ECTOPIC PREGNANCY
50.0000 mg/m2 | Freq: Once | INTRAMUSCULAR | Status: AC
  Administered 2023-06-19: 87.5 mg via INTRAMUSCULAR
  Filled 2023-06-19: qty 3.5

## 2023-06-19 NOTE — MAU Note (Addendum)
 Kim Mason is a 33 y.o. at [redacted]w[redacted]d here in MAU reporting: she reports she found out she was pregnant about 2 weeks ago. She reports she has a history of ectopic pregnancies and SAB. Today she's been having severe left sided abdominal cramping that she's rating a 10. She also reports nausea, vomiting, and diarrhea.  She reports moderate vaginal bleeding and has went through 1 pad today.   LMP: 05/09/23 Onset of complaint: today  Pain score: 10/10 abdomen Vitals:   06/19/23 1325  BP: 106/69  Pulse: 83  Resp: 20  Temp: 97.9 F (36.6 C)     FHT: deferred  Lab orders placed from triage: none

## 2023-06-19 NOTE — MAU Provider Note (Signed)
 History     CSN: [id number]  Arrival date and time: 06/19/23 1304   Event Date/Time   First Provider Initiated Contact with Patient 06/19/23 1335      Chief Complaint  Patient presents with   Abdominal Pain   HPI Kim Mason is a 33 y.o. [id number] at [redacted]w[redacted]d who presents with abdominal pain. Symptoms started this morning. Reports sharp pain in LLQ. Rates pain 10/10. Has had vaginal bleeding for a few weeks now. Reports bleeding is light. Uses one pad per day.  Being followed in the office for pregnancy of unknown location with abnormal HCGs. Has hx of 2 ectopic pregnancies, most recently in December. Both ectopics treated with methotrexate.   OB History     Gravida  7   Para  1   Term  1   Preterm      AB  5   Living  1      SAB  1   IAB  2   Ectopic  2   Multiple  0   Live Births  1           Past Medical History:  Diagnosis Date   Ectopic pregnancy    Herpes     Past Surgical History:  Procedure Laterality Date   NO PAST SURGERIES      Family History  Problem Relation Age of Onset   Hypertension Mother    Birth defects Father    Healthy Father    Allergic rhinitis Neg Hx    Angioedema Neg Hx    Asthma Neg Hx    Eczema Neg Hx    Immunodeficiency Neg Hx    Urticaria Neg Hx     Social History   Tobacco Use   Smoking status: Never   Smokeless tobacco: Never  Vaping Use   Vaping status: Never Used  Substance Use Topics   Alcohol use: Not Currently   Drug use: Never    Allergies:  Allergies  Allergen Reactions   Pollen Extract Itching and Swelling    Medications Prior to Admission  Medication Sig Dispense Refill Last Dose/Taking   metroNIDAZOLE (METROGEL) 1 % gel Apply 1 Application topically daily.   Past Week   ibuprofen (ADVIL) 600 MG tablet Take 1 tablet (600 mg total) by mouth every 6 (six) hours. 30 tablet 0    Olopatadine HCl (PATADAY) 0.2 % SOLN Place 1 drop into both eyes daily as needed. 2.5 mL 5    Prenatal Vit-Fe  Fumarate-FA (MULTIVITAMIN-PRENATAL) 27-0.8 MG TABS tablet Take 1 tablet by mouth daily at 12 noon.      tiZANidine (ZANAFLEX) 4 MG tablet Take 1 tablet (4 mg total) by mouth every 6 (six) hours as needed for muscle spasms. 30 tablet 0    valACYclovir (VALTREX) 500 MG tablet Take 500 mg by mouth 2 (two) times daily.       Review of Systems  All other systems reviewed and are negative.  Physical Exam   Blood pressure 124/77, pulse 88, temperature 97.9 F (36.6 C), temperature source Oral, resp. rate 20, height 5\' 7"  (1.702 m), weight 63.9 kg, last menstrual period 05/09/2023, SpO2 100%, unknown if currently breastfeeding.  Physical Exam Vitals and nursing note reviewed.  Constitutional:      General: She is not in acute distress.    Appearance: She is well-developed. She is not ill-appearing.  HENT:     Head: Normocephalic and atraumatic.  Eyes:     General: No scleral  icterus.       Right eye: No discharge.        Left eye: No discharge.     Conjunctiva/sclera: Conjunctivae normal.  Pulmonary:     Effort: Pulmonary effort is normal. No respiratory distress.  Abdominal:     General: There is no distension.     Palpations: Abdomen is soft.     Tenderness: There is abdominal tenderness in the left lower quadrant. There is no guarding or rebound.  Skin:    General: Skin is warm and dry.  Neurological:     General: No focal deficit present.     Mental Status: She is alert.  Psychiatric:        Mood and Affect: Mood normal.        Behavior: Behavior normal.     MAU Course  Procedures Results for orders placed or performed during the hospital encounter of 06/19/23 (from the past 24 hours)  CBC     Status: Abnormal   Collection Time: 06/19/23  1:47 PM  Result Value Ref Range   WBC 17.7 (H) 4.0 - 10.5 K/uL   RBC 3.94 3.87 - 5.11 MIL/uL   Hemoglobin 12.6 12.0 - 15.0 g/dL   HCT 16.1 09.6 - 04.5 %   MCV 93.1 80.0 - 100.0 fL   MCH 32.0 26.0 - 34.0 pg   MCHC 34.3 30.0 - 36.0  g/dL   RDW 40.9 81.1 - 91.4 %   Platelets 313 150 - 400 K/uL   nRBC 0.0 0.0 - 0.2 %  hCG, quantitative, pregnancy     Status: Abnormal   Collection Time: 06/19/23  1:47 PM  Result Value Ref Range   hCG, Beta Chain, Quant, S 507 (H) <5 mIU/mL  Comprehensive metabolic panel with GFR     Status: Abnormal   Collection Time: 06/19/23  1:47 PM  Result Value Ref Range   Sodium 136 135 - 145 mmol/L   Potassium 3.9 3.5 - 5.1 mmol/L   Chloride 103 98 - 111 mmol/L   CO2 24 22 - 32 mmol/L   Glucose, Bld 108 (H) 70 - 99 mg/dL   BUN 9 6 - 20 mg/dL   Creatinine, Ser 7.82 0.44 - 1.00 mg/dL   Calcium 8.7 (L) 8.9 - 10.3 mg/dL   Total Protein 7.4 6.5 - 8.1 g/dL   Albumin 3.9 3.5 - 5.0 g/dL   AST 29 15 - 41 U/L   ALT 29 0 - 44 U/L   Alkaline Phosphatase 31 (L) 38 - 126 U/L   Total Bilirubin 0.5 0.0 - 1.2 mg/dL   GFR, Estimated >95 >62 mL/min   Anion gap 9 5 - 15   US OB LESS THAN 14 WEEKS WITH OB TRANSVAGINAL Result Date: 06/19/2023 CLINICAL DATA:  Gestational age by last menstrual period of five weeks and 6 days. Last menstrual period 05/09/2023. G7 P1 A3 E2 to EXAM: OBSTETRIC <14 WK [place] AND TRANSVAGINAL OB US TECHNIQUE: Both transabdominal and transvaginal ultrasound examinations were performed for complete evaluation of the gestation as well as the maternal uterus, adnexal regions, and pelvic cul-de-sac. Transvaginal technique was performed to assess early pregnancy. COMPARISON:  None Available. FINDINGS: Intrauterine gestational sac: None Yolk sac:  Not Visualized. Embryo:  Not Visualized. Cardiac Activity: Not Visualized. Maternal uterus/adnexae: Corpus luteum cyst noted within the right ovary. Otherwise the right ovary is unremarkable. The left ovary is unremarkable. Adjacent to the left ovary there is a left adnexal complex mass measuring 1.5 x 1.1 x  1.5 cm with associated hypervascularity and ring of fire. Endometrium measuring 5 mm. Possible pericentimeter intramural anterior fundal uterine fibroid.  Other: Trace free simple left adnexal fluid. IMPRESSION: A 1.5 cm left adnexal ectopic pregnancy.  No definite rupture. These results were called by telephone at the time of interpretation on 06/19/2023 at 3:07 pm to provider Terri Fester , who verbally acknowledged these results. Electronically Signed   By: Morgane  Naveau M.D.   On: 06/19/2023 15:07    MDM Per care everywhere, HCGs at [facility]: 06/16/2023: 970.98 06/14/2023: 810.78 06/10/2023: 161.09 06/08/2023: 163.56.   Assessment and Plan   1. Left tubal pregnancy without intrauterine pregnancy   2. [redacted] weeks gestation of pregnancy    -HCG today is 62. Ultrasound shows left adnexal mass consistent with ectopic pregnancy. Measures 1.5 cm. No signs of rupture.  -Abdomen soft with some TTP. Hemoglobin stable. Pain improved with medication.  -Dr. Elisha Guillaume notified of patient's presentation & results. Will give methotrexate, with plan for patient to go to office on Wednesday for day 4 labs  The risks of methotrexate were reviewed including failure requiring repeat dosing or eventual surgery. She understands that methotrexate involves frequent return visits to monitor lab values and that she remains at risk of ectopic rupture until her beta is less than assay. ?The patient opts to proceed with methotrexate.  She has no history of hepatic or renal dysfunction, has normal BUN/Cr/LFT's/platelets.  She is felt to be reliable for follow-up. Side effects of photosensitivity & GI upset were discussed.  She knows to avoid direct sunlight and abstain from alcohol, aspirin and aspirin-like products for two weeks. She was counseled to discontinue any MVI with folic acid. ?She understands to follow up on D4 (Wednesday) and D7 (Saturday) for repeat BHCG and was given the instruction sheet. ?Strict ectopic precautions were reviewed, the patient knows to call with any abdominal pain, vomiting, fainting, or any concerns with her health.  Rh+, no Rhogam  necessary   Terri Fester 06/19/2023, 5:01 PM

## 2023-06-19 NOTE — Discharge Instructions (Signed)
Return to care  If you have heavier bleeding that soaks through more than 2 pads per hour for an hour or more If you bleed so much that you feel like you might pass out or you do pass out If you have significant abdominal pain that is not improved with Tylenol   

## 2023-06-20 DIAGNOSIS — O00102 Left tubal pregnancy without intrauterine pregnancy: Secondary | ICD-10-CM | POA: Diagnosis not present

## 2023-06-22 ENCOUNTER — Observation Stay (HOSPITAL_COMMUNITY): Admitting: Certified Registered Nurse Anesthetist

## 2023-06-22 ENCOUNTER — Inpatient Hospital Stay (HOSPITAL_COMMUNITY)

## 2023-06-22 ENCOUNTER — Other Ambulatory Visit: Payer: Self-pay

## 2023-06-22 ENCOUNTER — Observation Stay (HOSPITAL_COMMUNITY)
Admission: AD | Admit: 2023-06-22 | Discharge: 2023-06-22 | Disposition: A | Discharge status: Home / Self Care | Payer: Self-pay | Attending: Obstetrics and Gynecology | Admitting: Obstetrics and Gynecology

## 2023-06-22 ENCOUNTER — Encounter (HOSPITAL_COMMUNITY)
Admission: AD | Disposition: A | Discharge status: Home / Self Care | Payer: Self-pay | Source: Home / Self Care | Attending: Obstetrics and Gynecology

## 2023-06-22 ENCOUNTER — Encounter (HOSPITAL_COMMUNITY): Payer: Self-pay | Admitting: Obstetrics and Gynecology

## 2023-06-22 DIAGNOSIS — O00102 Left tubal pregnancy without intrauterine pregnancy: Secondary | ICD-10-CM | POA: Diagnosis not present

## 2023-06-22 DIAGNOSIS — O009 Unspecified ectopic pregnancy without intrauterine pregnancy: Secondary | ICD-10-CM | POA: Diagnosis not present

## 2023-06-22 DIAGNOSIS — Z3689 Encounter for other specified antenatal screening: Secondary | ICD-10-CM | POA: Diagnosis not present

## 2023-06-22 DIAGNOSIS — Z3A00 Weeks of gestation of pregnancy not specified: Secondary | ICD-10-CM | POA: Diagnosis not present

## 2023-06-22 HISTORY — PX: LAPAROSCOPIC UNILATERAL SALPINGECTOMY: SHX5934

## 2023-06-22 LAB — COMPREHENSIVE METABOLIC PANEL WITH GFR
ALT: 21 U/L (ref 0–44)
AST: 36 U/L (ref 15–41)
Albumin: 4.2 g/dL (ref 3.5–5.0)
Alkaline Phosphatase: 36 U/L — ABNORMAL LOW (ref 38–126)
Anion gap: 11 (ref 5–15)
BUN: 8 mg/dL (ref 6–20)
CO2: 23 mmol/L (ref 22–32)
Calcium: 9.2 mg/dL (ref 8.9–10.3)
Chloride: 102 mmol/L (ref 98–111)
Creatinine, Ser: 0.74 mg/dL (ref 0.44–1.00)
GFR, Estimated: >60 mL/min (ref >60)
Glucose, Bld: 87 mg/dL (ref 70–99)
Potassium: 4.8 mmol/L (ref 3.5–5.1)
Sodium: 136 mmol/L (ref 135–145)
Total Bilirubin: 1.3 mg/dL — ABNORMAL HIGH (ref 0.0–1.2)
Total Protein: 7.8 g/dL (ref 6.5–8.1)

## 2023-06-22 LAB — CBC
HCT: 33.1 % — ABNORMAL LOW (ref 36.0–46.0)
Hemoglobin: 11.2 g/dL — ABNORMAL LOW (ref 12.0–15.0)
MCH: 32.1 pg (ref 26.0–34.0)
MCHC: 33.8 g/dL (ref 30.0–36.0)
MCV: 94.8 fL (ref 80.0–100.0)
Platelets: 258 10*3/uL (ref 150–400)
RBC: 3.49 MIL/uL — ABNORMAL LOW (ref 3.87–5.11)
RDW: 12.2 % (ref 11.5–15.5)
WBC: 10.5 10*3/uL (ref 4.0–10.5)
nRBC: 0 % (ref 0.0–0.2)

## 2023-06-22 LAB — TYPE AND SCREEN
ABO/RH(D): A POS
Antibody Screen: NEGATIVE

## 2023-06-22 LAB — HCG, QUANTITATIVE, PREGNANCY: hCG, Beta Chain, Quant, S: 627 m[IU]/mL — ABNORMAL HIGH (ref <5)

## 2023-06-22 SURGERY — SALPINGECTOMY, UNILATERAL, LAPAROSCOPIC
Anesthesia: General | Laterality: Left

## 2023-06-22 MED ORDER — OXYCODONE HCL 5 MG/5ML PO SOLN: 5.0000 mg | Freq: Once | ORAL | Status: DC | PRN

## 2023-06-22 MED ORDER — BUPIVACAINE HCL (PF) 0.25 % IJ SOLN
INTRAMUSCULAR | Status: DC | PRN
  Administered 2023-06-22: 30 mL

## 2023-06-22 MED ORDER — FENTANYL CITRATE (PF) 100 MCG/2ML IJ SOLN
50.0000 ug | Freq: Once | INTRAMUSCULAR | Status: DC
  Filled 2023-06-22: qty 2

## 2023-06-22 MED ORDER — LACTATED RINGERS IV SOLN: INTRAVENOUS | Status: DC

## 2023-06-22 MED ORDER — DEXAMETHASONE SODIUM PHOSPHATE 10 MG/ML IJ SOLN
INTRAMUSCULAR | Status: DC | PRN
  Administered 2023-06-22: 10 mg via INTRAVENOUS

## 2023-06-22 MED ORDER — PHENYLEPHRINE 80 MCG/ML (10ML) SYRINGE FOR IV PUSH (FOR BLOOD PRESSURE SUPPORT)
PREFILLED_SYRINGE | INTRAVENOUS | Status: AC
Start: 2023-06-22 — End: ?
  Filled 2023-06-22: qty 10

## 2023-06-22 MED ORDER — FENTANYL CITRATE (PF) 100 MCG/2ML IJ SOLN
50.0000 ug | Freq: Once | INTRAMUSCULAR | Status: AC
  Administered 2023-06-22: 50 ug via INTRAVENOUS
  Filled 2023-06-22: qty 2

## 2023-06-22 MED ORDER — MIDAZOLAM HCL 2 MG/2ML IJ SOLN
INTRAMUSCULAR | Status: DC | PRN
  Administered 2023-06-22: 2 mg via INTRAVENOUS

## 2023-06-22 MED ORDER — CHLORHEXIDINE GLUCONATE 0.12 % MT SOLN: 15.0000 mL | Freq: Once | OROMUCOSAL | Status: AC

## 2023-06-22 MED ORDER — ROCURONIUM BROMIDE 10 MG/ML (PF) SYRINGE
PREFILLED_SYRINGE | INTRAVENOUS | Status: AC
Start: 2023-06-22 — End: ?
  Filled 2023-06-22: qty 10

## 2023-06-22 MED ORDER — LIDOCAINE 2% (20 MG/ML) 5 ML SYRINGE
INTRAMUSCULAR | Status: DC | PRN
  Administered 2023-06-22: 60 mg via INTRAVENOUS

## 2023-06-22 MED ORDER — PROPOFOL 10 MG/ML IV BOLUS
INTRAVENOUS | Status: AC
  Filled 2023-06-22: qty 20

## 2023-06-22 MED ORDER — ACETAMINOPHEN 10 MG/ML IV SOLN
INTRAVENOUS | Status: AC
  Filled 2023-06-22: qty 100

## 2023-06-22 MED ORDER — PROPOFOL 10 MG/ML IV BOLUS
INTRAVENOUS | Status: DC | PRN
  Administered 2023-06-22: 160 mg via INTRAVENOUS

## 2023-06-22 MED ORDER — DEXAMETHASONE SODIUM PHOSPHATE 10 MG/ML IJ SOLN
INTRAMUSCULAR | Status: AC
  Filled 2023-06-22: qty 1

## 2023-06-22 MED ORDER — LIDOCAINE 2% (20 MG/ML) 5 ML SYRINGE
INTRAMUSCULAR | Status: AC
  Filled 2023-06-22: qty 5

## 2023-06-22 MED ORDER — SODIUM CHLORIDE 0.9 % IV SOLN: INTRAVENOUS | Status: DC | PRN

## 2023-06-22 MED ORDER — MIDAZOLAM HCL 2 MG/2ML IJ SOLN
INTRAMUSCULAR | Status: AC
  Filled 2023-06-22: qty 2

## 2023-06-22 MED ORDER — ROCURONIUM BROMIDE 10 MG/ML (PF) SYRINGE
PREFILLED_SYRINGE | INTRAVENOUS | Status: DC | PRN
  Administered 2023-06-22: 50 mg via INTRAVENOUS

## 2023-06-22 MED ORDER — ACETAMINOPHEN 10 MG/ML IV SOLN
INTRAVENOUS | Status: DC | PRN
  Administered 2023-06-22: 1000 mg via INTRAVENOUS

## 2023-06-22 MED ORDER — OXYCODONE HCL 5 MG PO TABS: 5.0000 mg | ORAL_TABLET | Freq: Four times a day (QID) | ORAL | 0 refills | Status: AC | PRN

## 2023-06-22 MED ORDER — OXYCODONE HCL 5 MG PO TABS: 5.0000 mg | ORAL_TABLET | Freq: Once | ORAL | Status: DC | PRN

## 2023-06-22 MED ORDER — POVIDONE-IODINE 10 % EX SWAB
2.0000 | Freq: Once | CUTANEOUS | Status: AC
  Administered 2023-06-22: 2 via TOPICAL

## 2023-06-22 MED ORDER — ONDANSETRON HCL 4 MG/2ML IJ SOLN
INTRAMUSCULAR | Status: AC
  Filled 2023-06-22: qty 2

## 2023-06-22 MED ORDER — FENTANYL CITRATE (PF) 250 MCG/5ML IJ SOLN
INTRAMUSCULAR | Status: AC
  Filled 2023-06-22: qty 5

## 2023-06-22 MED ORDER — ORAL CARE MOUTH RINSE: 15.0000 mL | Freq: Once | OROMUCOSAL | Status: AC

## 2023-06-22 MED ORDER — FENTANYL CITRATE (PF) 100 MCG/2ML IJ SOLN: 25.0000 ug | INTRAMUSCULAR | Status: DC | PRN

## 2023-06-22 MED ORDER — SODIUM CHLORIDE 0.9 % IR SOLN
Status: DC | PRN
Start: 2023-06-22 — End: 2023-06-22
  Administered 2023-06-22: 1000 mL

## 2023-06-22 MED ORDER — SUCCINYLCHOLINE CHLORIDE 200 MG/10ML IV SOSY
PREFILLED_SYRINGE | INTRAVENOUS | Status: DC | PRN
  Administered 2023-06-22: 100 mg via INTRAVENOUS

## 2023-06-22 MED ORDER — 0.9 % SODIUM CHLORIDE (POUR BTL) OPTIME
TOPICAL | Status: DC | PRN
  Administered 2023-06-22: 1000 mL

## 2023-06-22 MED ORDER — DEXMEDETOMIDINE HCL IN NACL 80 MCG/20ML IV SOLN
INTRAVENOUS | Status: AC
  Filled 2023-06-22: qty 20

## 2023-06-22 MED ORDER — BUPIVACAINE HCL (PF) 0.5 % IJ SOLN
INTRAMUSCULAR | Status: AC
  Filled 2023-06-22: qty 30

## 2023-06-22 MED ORDER — CHLORHEXIDINE GLUCONATE 0.12 % MT SOLN
OROMUCOSAL | Status: AC
  Administered 2023-06-22: 15 mL via OROMUCOSAL
  Filled 2023-06-22: qty 15

## 2023-06-22 MED ORDER — SUGAMMADEX SODIUM 200 MG/2ML IV SOLN
INTRAVENOUS | Status: DC | PRN
  Administered 2023-06-22: 150 mg via INTRAVENOUS

## 2023-06-22 MED ORDER — PHENYLEPHRINE 80 MCG/ML (10ML) SYRINGE FOR IV PUSH (FOR BLOOD PRESSURE SUPPORT)
PREFILLED_SYRINGE | INTRAVENOUS | Status: DC | PRN
  Administered 2023-06-22 (×4): 160 ug via INTRAVENOUS

## 2023-06-22 MED ORDER — ONDANSETRON HCL 4 MG/2ML IJ SOLN: 4.0000 mg | Freq: Four times a day (QID) | INTRAMUSCULAR | Status: DC | PRN

## 2023-06-22 MED ORDER — ACETAMINOPHEN 500 MG PO TABS: 1000.0000 mg | ORAL_TABLET | Freq: Three times a day (TID) | ORAL | 0 refills | Status: AC | PRN

## 2023-06-22 MED ORDER — DEXMEDETOMIDINE HCL IN NACL 80 MCG/20ML IV SOLN
INTRAVENOUS | Status: DC | PRN
  Administered 2023-06-22: 8 ug via INTRAVENOUS

## 2023-06-22 MED ORDER — SODIUM CHLORIDE 0.9 % IV SOLN
2.0000 g | Freq: Once | INTRAVENOUS | Status: AC
  Administered 2023-06-22: 2 g via INTRAVENOUS
  Filled 2023-06-22: qty 2

## 2023-06-22 MED ORDER — SUCCINYLCHOLINE CHLORIDE 200 MG/10ML IV SOSY
PREFILLED_SYRINGE | INTRAVENOUS | Status: AC
  Filled 2023-06-22: qty 10

## 2023-06-22 MED ORDER — ONDANSETRON HCL 4 MG/2ML IJ SOLN
INTRAMUSCULAR | Status: DC | PRN
  Administered 2023-06-22: 4 mg via INTRAVENOUS

## 2023-06-22 MED ORDER — FENTANYL CITRATE (PF) 250 MCG/5ML IJ SOLN
INTRAMUSCULAR | Status: DC | PRN
  Administered 2023-06-22: 100 ug via INTRAVENOUS

## 2023-06-22 MED ORDER — IBUPROFEN 800 MG PO TABS: 800.0000 mg | ORAL_TABLET | Freq: Three times a day (TID) | ORAL | 0 refills | Status: AC | PRN

## 2023-06-22 SURGICAL SUPPLY — 30 items
APPLICATOR ARISTA FLEXITIP XL (MISCELLANEOUS) IMPLANT
CABLE HIGH FREQUENCY MONO STRZ (ELECTRODE) IMPLANT
DRAPE SURG IRRIG POUCH 19X23 (DRAPES) ×2 IMPLANT
DRSG OPSITE POSTOP 3X4 (GAUZE/BANDAGES/DRESSINGS) ×1 IMPLANT
GLOVE BIOGEL M 6.5 STRL (GLOVE) ×2 IMPLANT
GLOVE BIOGEL PI IND STRL 6.5 (GLOVE) ×4 IMPLANT
GOWN STRL REUS W/ TWL LRG LVL3 (GOWN DISPOSABLE) ×2 IMPLANT
HEMOSTAT ARISTA ABSORB 3G PWDR (HEMOSTASIS) IMPLANT
IRRIG SUCT STRYKERFLOW 2 WTIP (MISCELLANEOUS) ×2 IMPLANT
IRRIGATION SUCT STRKRFLW 2 WTP (MISCELLANEOUS) ×1 IMPLANT
KIT PINK PAD W/HEAD ARE REST (MISCELLANEOUS) ×2 IMPLANT
KIT PINK PAD W/HEAD ARM REST (MISCELLANEOUS) ×1 IMPLANT
KIT TURNOVER KIT B (KITS) ×2 IMPLANT
NS IRRIG 1000ML POUR BTL (IV SOLUTION) ×2 IMPLANT
PACK LAPAROSCOPY BASIN (CUSTOM PROCEDURE TRAY) ×2 IMPLANT
POUCH LAPAROSCOPIC INSTRUMENT (MISCELLANEOUS) ×1 IMPLANT
SET TUBE SMOKE EVAC HIGH FLOW (TUBING) ×2 IMPLANT
SHEARS HARMONIC ACE PLUS 36CM (ENDOMECHANICALS) IMPLANT
SLEEVE ADV FIXATION 5X100MM (TROCAR) ×2 IMPLANT
SOL ELECTROSURG ANTI STICK (MISCELLANEOUS) IMPLANT
SOLUTION ELECTROSURG ANTI STCK (MISCELLANEOUS) IMPLANT
SUT VICRYL 0 UR6 27IN ABS (SUTURE) ×1 IMPLANT
SUT VICRYL 4-0 PS2 18IN ABS (SUTURE) ×2 IMPLANT
SYS BAG RETRIEVAL 10MM (BASKET) ×2 IMPLANT
SYSTEM BAG RETRIEVAL 10MM (BASKET) ×1 IMPLANT
TOWEL GREEN STERILE FF (TOWEL DISPOSABLE) ×2 IMPLANT
TRAY FOLEY W/BAG SLVR 14FR (SET/KITS/TRAYS/PACK) ×2 IMPLANT
TROCAR ADV FIXATION 11X100MM (TROCAR) IMPLANT
TROCAR ADV FIXATION 5X100MM (TROCAR) ×2 IMPLANT
WARMER LAPAROSCOPE (MISCELLANEOUS) ×2 IMPLANT

## 2023-06-22 NOTE — H&P (Addendum)
 GYN ADMISSION HISTORY AND PHYSICAL  Kim Mason is a 33 y.o. female [phone] with IUP at [redacted]w[redacted]d by LMP presenting for rupture ectopic pregnancy with associated abdominal pain that started in the MAU waiting room. She was previously treated with methotrexate 4/13. Hx of ectopic x2 prior in left adnexa. HCG repeat in office increased from 500 to 600 following methotrexate and was advised to go to MAU for further evaluation.   Past Medical History: Past Medical History:  Diagnosis Date   Ectopic pregnancy    Herpes     Past Surgical History: Past Surgical History:  Procedure Laterality Date   NO PAST SURGERIES      Obstetrical History: OB History     Gravida  7   Para  1   Term  1   Preterm      AB  5   Living  1      SAB  1   IAB  2   Ectopic  2   Multiple  0   Live Births  1           Social History Social History   Socioeconomic History   Marital status: Single    Spouse name: Not on file   Number of children: Not on file   Years of education: Not on file   Highest education level: Not on file  Occupational History   Not on file  Tobacco Use   Smoking status: Never   Smokeless tobacco: Never  Vaping Use   Vaping status: Never Used  Substance and Sexual Activity   Alcohol use: Not Currently   Drug use: Never   Sexual activity: Yes  Other Topics Concern   Not on file  Social History Narrative   Not on file   Social Drivers of Health   Financial Resource Strain: Not on file  Food Insecurity: No Food Insecurity (06/04/2021)   Received from [facility], [facility]   Hunger Vital Sign    Worried About Running Out of Food in the Last Year: Never true    Ran Out of Food in the Last Year: Never true  Transportation Needs: Not on file  Physical Activity: Not on file  Stress: Not on file  Social Connections: Unknown (07/09/2021)   Received from [facility], [facility]   Social Network    Social Network: Not on file    Family  History: Family History  Problem Relation Age of Onset   Hypertension Mother    Birth defects Father    Healthy Father    Allergic rhinitis Neg Hx    Angioedema Neg Hx    Asthma Neg Hx    Eczema Neg Hx    Immunodeficiency Neg Hx    Urticaria Neg Hx     Allergies: Allergies  Allergen Reactions   Pollen Extract Itching and Swelling    Medications Prior to Admission  Medication Sig Dispense Refill Last Dose/Taking   metroNIDAZOLE (METROGEL) 1 % gel Apply 1 Application topically daily.      Olopatadine HCl (PATADAY) 0.2 % SOLN Place 1 drop into both eyes daily as needed. 2.5 mL 5    tiZANidine (ZANAFLEX) 4 MG tablet Take 1 tablet (4 mg total) by mouth every 6 (six) hours as needed for muscle spasms. 30 tablet 0    valACYclovir (VALTREX) 500 MG tablet Take 500 mg by mouth 2 (two) times daily.        Review of Systems   All systems  reviewed and negative except as stated in HPI  Blood pressure 120/87, pulse 100, temperature 98.5 F (36.9 C), temperature source Oral, resp. rate 18, height 5\' 7"  (1.702 m), weight 64 kg, last menstrual period 05/09/2023, SpO2 99%, unknown if currently breastfeeding. General appearance: alert and no distress Lungs: clear to auscultation bilaterally Heart: regular rate and rhythm Abdomen: soft, mildly-tender over LLQ; bowel sounds normal Pelvic: deferred Extremities: No LE edema   Pre-op labs: ABO, Rh: --/--/A POS (04/16 1732) Antibody: NEG (04/16 1732)   Results for orders placed or performed during the hospital encounter of 06/22/23 (from the past 24 hours)  CBC   Collection Time: 06/22/23  5:32 PM  Result Value Ref Range   WBC 10.5 4.0 - 10.5 K/uL   RBC 3.49 (L) 3.87 - 5.11 MIL/uL   Hemoglobin 11.2 (L) 12.0 - 15.0 g/dL   HCT 16.1 (L) 09.6 - 04.5 %   MCV 94.8 80.0 - 100.0 fL   MCH 32.1 26.0 - 34.0 pg   MCHC 33.8 30.0 - 36.0 g/dL   RDW 40.9 81.1 - 91.4 %   Platelets 258 150 - 400 K/uL   nRBC 0.0 0.0 - 0.2 %  Comprehensive metabolic  panel with GFR   Collection Time: 06/22/23  5:32 PM  Result Value Ref Range   Sodium 136 135 - 145 mmol/L   Potassium 4.8 3.5 - 5.1 mmol/L   Chloride 102 98 - 111 mmol/L   CO2 23 22 - 32 mmol/L   Glucose, Bld 87 70 - 99 mg/dL   BUN 8 6 - 20 mg/dL   Creatinine, Ser 7.82 0.44 - 1.00 mg/dL   Calcium 9.2 8.9 - 95.6 mg/dL   Total Protein 7.8 6.5 - 8.1 g/dL   Albumin 4.2 3.5 - 5.0 g/dL   AST 36 15 - 41 U/L   ALT 21 0 - 44 U/L   Alkaline Phosphatase 36 (L) 38 - 126 U/L   Total Bilirubin 1.3 (H) 0.0 - 1.2 mg/dL   GFR, Estimated >21 >30 mL/min   Anion gap 11 5 - 15  hCG, quantitative, pregnancy   Collection Time: 06/22/23  5:32 PM  Result Value Ref Range   hCG, Beta Chain, Quant, S 627 (H) <5 mIU/mL  Type and screen [facility]   Collection Time: 06/22/23  5:32 PM  Result Value Ref Range   ABO/RH(D) A POS    Antibody Screen NEG    Sample Expiration      06/25/2023,2359 Performed at [facility], [address], [place], [place] [address]     Patient Active Problem List   Diagnosis Date Noted   Ruptured ectopic pregnancy 06/22/2023   Normal labor and delivery 06/05/2021   Seasonal and perennial allergic rhinitis 07/27/2016   Seasonal allergic conjunctivitis 07/27/2016   CLINICAL DATA:  Known ectopic pregnancy. Receiving methotrexate. Increasing pain   EXAM: TRANSVAGINAL OB ULTRASOUND   TECHNIQUE: Transvaginal ultrasound was performed for complete evaluation of the gestation as well as the maternal uterus, adnexal regions, and pelvic cul-de-sac.   COMPARISON:  Ultrasound 06/19/2023.   FINDINGS: Uterus and endometrium: Heterogeneous myometrium. Uterine size was not measured by the sonographer at this time. The endometrial stripe measures 8 mm and is heterogeneous. No intrauterine pregnancy.   Ovaries/adnexa: Right ovary has small follicles and measures 2.3 x 1.6 x 1.9 cm. Left ovary measures 2.7 x 1.7 x 1.8 cm and also has small follicles.  Adjacent left ovary is a more first heterogeneous  echogenic area which is new. There is also scattered free fluid in the pelvis which is mildly complex with some internal echoes. With the change and the patient's history this is worrisome for a rupturing ectopic.   Critical Value/emergent results were called by telephone at the time of interpretation on 06/22/2023 at 6:16 pm to provider Debbe Fail with maternal fetal medicine, who verbally acknowledged these results.   IMPRESSION: Interval development of complex fluid with a lobular heterogeneous echogenic area in the left adnexa measuring up to 6.4 cm. With the change and the known history of an ectopic, worrisome for interval development of rupture.     Electronically Signed   By: Adrianna Horde M.D.   On: 06/22/2023 18:17  Assessment/Plan:  Kim Mason is a 33 y.o. [id number] at [redacted]w[redacted]d here for larascopic removal of ectopic pregnant and left salpingectomy 2/2 ruptured ectopic pregnancy.    Simone Autry-Lott, DO  06/22/2023, 7:16 PM   Patient seen and examined. Agree with diagnosis of ruptured ectopic pregnancy. Recommend laparoscopic removal of ectopic pregnancy and possible left salpingectomy. R/B/A of surgery discussed with patient including but not limited to infection, bleeding , damage to bowel bladder and surrounding organs with the need for further surgery. R/O transfusion discussed including r/o transfusion reaction/ HIV/ Hepatitis B and C. Pt voiced understanding and agrees with  the above surgery.

## 2023-06-22 NOTE — Anesthesia Preprocedure Evaluation (Signed)
 Anesthesia Evaluation  Patient identified by MRN, date of birth, ID band Patient awake    Reviewed: Allergy & Precautions, H&P , NPO status , Patient's Chart, lab work & pertinent test results  Airway Mallampati: II   Neck ROM: full    Dental   Pulmonary neg pulmonary ROS   breath sounds clear to auscultation       Cardiovascular negative cardio ROS  Rhythm:regular Rate:Normal     Neuro/Psych    GI/Hepatic   Endo/Other    Renal/GU      Musculoskeletal   Abdominal   Peds  Hematology   Anesthesia Other Findings   Reproductive/Obstetrics Ectopic pregnancy                             Anesthesia Physical Anesthesia Plan  ASA: 1 and emergent  Anesthesia Plan: General   Post-op Pain Management:    Induction: Intravenous, Rapid sequence and Cricoid pressure planned  PONV Risk Score and Plan: 3 and Ondansetron, Dexamethasone, Midazolam and Treatment may vary due to age or medical condition  Airway Management Planned: Oral ETT  Additional Equipment:   Intra-op Plan:   Post-operative Plan: Extubation in OR  Informed Consent: I have reviewed the patients History and Physical, chart, labs and discussed the procedure including the risks, benefits and alternatives for the proposed anesthesia with the patient or authorized representative who has indicated his/her understanding and acceptance.     Dental advisory given  Plan Discussed with: CRNA, Anesthesiologist and Surgeon  Anesthesia Plan Comments:        Anesthesia Quick Evaluation

## 2023-06-22 NOTE — Anesthesia Procedure Notes (Signed)
 Procedure Name: Intubation Date/Time: 06/22/2023 7:50 PM  Performed by: Melinda Sprawls, CRNAPre-anesthesia Checklist: Patient identified, Emergency Drugs available, Suction available, Patient being monitored and Timeout performed Patient Re-evaluated:Patient Re-evaluated prior to induction Oxygen Delivery Method: Circle system utilized Preoxygenation: Pre-oxygenation with 100% oxygen Induction Type: IV induction Ventilation: Mask ventilation without difficulty Laryngoscope Size: Mac and 3 Grade View: Grade I Tube type: Oral Tube size: 7.0 mm Number of attempts: 1 Airway Equipment and Method: Stylet Placement Confirmation: ETT inserted through vocal cords under direct vision, positive ETCO2 and breath sounds checked- equal and bilateral Secured at: 22 cm Tube secured with: Tape Dental Injury: Teeth and Oropharynx as per pre-operative assessment  Comments: Smooth IV Induction. Eyes taped. RSI Performed. DL x 1 with grade 1 view. Atraumatically placed, teeth and lip remain intact as pre-op. Secured with tape. Bilateral breath sounds +/=, EtCO2 +, Adequate TV, VSS.

## 2023-06-22 NOTE — Transfer of Care (Signed)
 Immediate Anesthesia Transfer of Care Note  Patient: Kim Mason  Procedure(s) Performed: LAPAROSCOPIC LEFT SALPINGECTOMY WITH REMOVAL OF ECTOPIC PREGNANCY (Left)  Patient Location: PACU  Anesthesia Type:General  Level of Consciousness: awake and alert   Airway & Oxygen Therapy: Patient Spontanous Breathing and Patient connected to face mask oxygen  Post-op Assessment: Report given to RN and Post -op Vital signs reviewed and stable  Post vital signs: Reviewed and stable  Last Vitals:  Vitals Value Taken Time  BP 114/80 06/22/23 2132  Temp    Pulse 99 06/22/23 2132  Resp 17 06/22/23 2132  SpO2 95 % 06/22/23 2132  Vitals shown include unfiled device data.  Last Pain:  Vitals:   06/22/23 1909  TempSrc: Oral  PainSc: 0-No pain         Complications: No notable events documented.

## 2023-06-22 NOTE — MAU Note (Signed)
.  Kim Mason is a 33 y.o. at [redacted]w[redacted]d here in MAU reporting: Had first dose of methotrexate on Sunday. Had f/u labs in office today. Pt stated  level was 769 and OB told her to come to MAU for second dose of methotrexate.  While pt waiting in lobby started having pain on he left side 8/10. Was not hurting earlier today. Has been having light bleeding.   LMP:  Onset of complaint: today Pain score: 8 There were no vitals filed for this visit.   FHT: n/a  Lab orders placed from triage: hcg

## 2023-06-22 NOTE — Op Note (Signed)
 06/22/2023  9:02 PM  PATIENT:  Kim Mason  33 y.o. female  PRE-OPERATIVE DIAGNOSIS:  ruptured ectopic  POST-OPERATIVE DIAGNOSIS:  RUPTURED LEFT ECTOPIC PREGNANCY  PROCEDURE:  Procedure(s): LAPAROSCOPIC LEFT SALPINGECTOMY WITH REMOVAL OF ECTOPIC PREGNANCY (Left)  SURGEON:  Surgeons and Role:    Arlee Lace, MD - Primary    * Autry-Lott, Jeneane Miracle, DO - Assisting  PHYSICIAN ASSISTANT:   ASSISTANTS: Dr. Jeneane Miracle Autry-Lott Assisted. An experienced assistant was required given the standard of surgical care given the complexity of the case.  This assistant was needed for exposure, dissection, suctioning, retraction, instrument exchange, assisting with delivery with administration of fundal pressure, and for overall help during the procedure.    ANESTHESIA:   general  EBL:  200 mL   BLOOD ADMINISTERED:none  DRAINS: Urinary Catheter (Foley)   LOCAL MEDICATIONS USED:  MARCAINE     SPECIMEN:  Source of Specimen:  left fallopian tube and ectopic pregnancy   DISPOSITION OF SPECIMEN:  PATHOLOGY  COUNTS:  YES  TOURNIQUET:  * No tourniquets in log *  DICTATION: .Note written in EPIC  PLAN OF CARE: Discharge to home after PACU  PATIENT DISPOSITION:  PACU - hemodynamically stable.   Delay start of Pharmacological VTE agent (>24hrs) due to surgical blood loss or risk of bleeding: not applicable  Findings: normal external genitalia vaginal mucosa and cervix. Approximately 200 cc of blood noted in the pelvis at the beginning of the surgery. Ruptured left fallopian tube with ectopic pregnancy. Normal appearing right fallopian tube. Normal ovaries bilaterally. Normal appearing uterus.   Procedure: The patient was taken to the operating room #8 at [facility] where she was  placed under general anesthesia. Time Out was performed.  She was  Prepped and draped in the normal sterile fashion. A foley catheter was placed. A uterine manipulator was placed. Attention was turned to the  abdomen where the umbilicus was injected with 10 cc of marcaine. A 10 mm trocar was placed under direct visualization. Pneumoperitoneum was achieved with C02 gas... A 5 mm trocar was placed in the right and left lower quadrants. Each trocar site was injected with 10 cc of marcaine prior to trocar placement. The harmonic scalpel was used to excise  The left fallopian tube along the mesosalpinx to the cornu .  An endo catch bag was placed through the 10 mm umbilical port. The specimen was placed in the bag and removed through the umbilical incision. Pneumoperitoneum was reestablished.The pelvis was irrigated.  Excellent hemostasis was noted. All trocars were removed under direct visualization . The pneumoperitoneum was released.  The fascia of the umbilical incision was re approximated with 0 vicryl. The skin incisions were closed with 4-0 vicryl and derma bond.  The patient was taken to the recovery room awake and in stable condition.  Sponge lap and needle counts were correct times 2.

## 2023-06-22 NOTE — MAU Provider Note (Signed)
 History     CSN: [id number]  Arrival date and time: 06/22/23 1459   Event Date/Time   First Provider Initiated Contact with Patient 06/22/2023  7:05 PM   Chief Complaint  Patient presents with   Abdominal Pain    HPI  Kim Mason is a 33 y.o. [id number] at [redacted]w[redacted]d who presents to the MAU for second dose Methotrexate. Patient was seen on 4/13 for vaginal bleeding and sharp LLQ pain. Her b-hCG trend is as below:   06/22/2023: 627 06/19/2023: 507 06/16/2023: 970.98 06/14/2023: 810.78 06/10/2023: 299.43 06/08/2023: 163.56.   Patient was in waiting room and began experiencing severe left lower quadrant pain. Describes as sharp, 10/10 pain. She is tearful at bedside. Not lightheaded or dizzy.  Past Medical History:  Diagnosis Date   Ectopic pregnancy    Herpes     Past Surgical History:  Procedure Laterality Date   NO PAST SURGERIES      Family History  Problem Relation Age of Onset   Hypertension Mother    Birth defects Father    Healthy Father    Allergic rhinitis Neg Hx    Angioedema Neg Hx    Asthma Neg Hx    Eczema Neg Hx    Immunodeficiency Neg Hx    Urticaria Neg Hx     Social History   Tobacco Use   Smoking status: Never   Smokeless tobacco: Never  Vaping Use   Vaping status: Never Used  Substance Use Topics   Alcohol use: Not Currently   Drug use: Never    Allergies:  Allergies  Allergen Reactions   Pollen Extract Itching and Swelling    Medications Prior to Admission  Medication Sig Dispense Refill Last Dose/Taking   metroNIDAZOLE (METROGEL) 1 % gel Apply 1 Application topically daily.      Olopatadine HCl (PATADAY) 0.2 % SOLN Place 1 drop into both eyes daily as needed. 2.5 mL 5    tiZANidine (ZANAFLEX) 4 MG tablet Take 1 tablet (4 mg total) by mouth every 6 (six) hours as needed for muscle spasms. 30 tablet 0    valACYclovir (VALTREX) 500 MG tablet Take 500 mg by mouth 2 (two) times daily.       ROS reviewed and pertinent positives and negatives  as documented in HPI.  Physical Exam   Blood pressure 120/87, pulse 100, temperature 98.5 F (36.9 C), temperature source Oral, resp. rate 18, height 5\' 7"  (1.702 m), weight 64 kg, last menstrual period 05/09/2023, SpO2 99%, unknown if currently breastfeeding.  Physical Exam Constitutional:      General: She is not in acute distress.    Appearance: Normal appearance. She is not ill-appearing.  HENT:     Head: Normocephalic and atraumatic.  Cardiovascular:     Rate and Rhythm: Normal rate.  Pulmonary:     Effort: Pulmonary effort is normal.     Breath sounds: Normal breath sounds.  Abdominal:     Palpations: Abdomen is soft.     Tenderness: There is abdominal tenderness in the left lower quadrant. There is no guarding.  Musculoskeletal:        General: Normal range of motion.  Skin:    General: Skin is warm and dry.     Findings: No rash.  Neurological:     General: No focal deficit present.     Mental Status: She is alert and oriented to person, place, and time.     MAU Course  Procedures  MDM 33 y.o.  [id number] at [redacted]w[redacted]d presenting for second dose of Methotrexate. Her pain acutely worsened while in waiting room. She was pulled back into room, and labs. She was hemodynamically stable throughout MAU course and HgB down from 12.6 on 4/13 to 11.2 on 4/16. Her abdominal exam was reassuring. U/S performed with findings concerning for interval development of ruptured ectopic measuring up to 6.4cm with scattered free fluid in pelvis. Dr. Wynona Hedger called w results and was taken to OR for management.  Assessment and Plan     ICD-10-CM   1. Ruptured ectopic pregnancy  O00.90     To OR with Dr. Wynona Hedger Last ate at 1400    Melanie Spires, MD OB Fellow, Faculty [facility]  06/22/2023, 8:57 PM

## 2023-06-23 ENCOUNTER — Encounter (HOSPITAL_COMMUNITY): Payer: Self-pay | Admitting: Obstetrics and Gynecology

## 2023-06-23 NOTE — Anesthesia Postprocedure Evaluation (Signed)
 Anesthesia Post Note  Patient: Kim Mason  Procedure(s) Performed: LAPAROSCOPIC LEFT SALPINGECTOMY WITH REMOVAL OF ECTOPIC PREGNANCY (Left)     Patient location during evaluation: PACU Anesthesia Type: General Level of consciousness: awake and alert Pain management: pain level controlled Vital Signs Assessment: post-procedure vital signs reviewed and stable Respiratory status: spontaneous breathing, nonlabored ventilation, respiratory function stable and patient connected to nasal cannula oxygen Cardiovascular status: blood pressure returned to baseline and stable Postop Assessment: no apparent nausea or vomiting Anesthetic complications: no   No notable events documented.  Last Vitals:  Vitals:   06/22/23 2145 06/22/23 2200  BP: 108/74 111/81  Pulse: 96 95  Resp: 13 18  Temp:  36.7 C  SpO2: 97% 99%    Last Pain:  Vitals:   06/22/23 2200  TempSrc:   PainSc: 0-No pain                 Jamieson Hetland S

## 2023-06-24 LAB — SURGICAL PATHOLOGY

## 2023-07-06 DIAGNOSIS — O009 Unspecified ectopic pregnancy without intrauterine pregnancy: Secondary | ICD-10-CM | POA: Diagnosis not present

## 2023-08-03 NOTE — Discharge Summary (Signed)
 Physician Discharge Summary  Patient ID: Kim Mason MRN: [id number] DOB/AGE: [birth year] 33 y.o.  Admit date: 06/22/2023 Discharge date:06/22/2023  Admission Diagnoses:Ruptured Ectopic pregnancy  Discharge Diagnoses:  Principal Problem:   Ruptured ectopic pregnancy   Discharged Condition: stable  Hospital Course: pt underwent  laparoscopic left salpingectomy with removal of ectopic pregnancy on 06/22/2023. She did well postoperatively and was discharged on pOD #0 in stable condition.   Consults: None  Significant Diagnostic Studies: labs: hcg 627/ hgb 11.2   Treatments: surgery: laparoscopic left salpingectomy with removal of ectopic pregnancy   Discharge Exam: Blood pressure 111/81, pulse 95, temperature 98 F (36.7 C), resp. rate 18, height 5\' 7"  (1.702 m), weight 64 kg, last menstrual period 05/09/2023, SpO2 99%, unknown if currently breastfeeding. General appearance: alert, cooperative, and no distress GI: soft appropriately tender nondistended  Extremities: extremities normal, atraumatic, no cyanosis or edema Incision/Wound:  Disposition: Discharge disposition: 01-Home or Self Care       Discharge Instructions     Call MD for:  persistant nausea and vomiting   Complete by: As directed    Call MD for:  redness, tenderness, or signs of infection (pain, swelling, redness, odor or green/yellow discharge around incision site)   Complete by: As directed    Call MD for:  severe uncontrolled pain   Complete by: As directed    Call MD for:  temperature >100.4   Complete by: As directed    Diet general   Complete by: As directed    Driving Restrictions   Complete by: As directed    Avoid driving for 3 days or while requiring narcotics for pain   Increase activity slowly   Complete by: As directed    Lifting restrictions   Complete by: As directed    Avoid lifting over 10 lbs for 2 weeks   May shower / Bathe   Complete by: As directed    May walk up steps    Complete by: As directed    No wound care   Complete by: As directed    Sexual Activity Restrictions   Complete by: As directed    Avoid sex for 2 weeks      Allergies as of 06/22/2023       Reactions   Pollen Extract Itching, Swelling        Medication List     TAKE these medications    acetaminophen  500 MG tablet Commonly known as: TYLENOL  Take 2 tablets (1,000 mg total) by mouth every 8 (eight) hours as needed for mild pain (pain score 1-3).   ibuprofen  800 MG tablet Commonly known as: ADVIL  Take 1 tablet (800 mg total) by mouth every 8 (eight) hours as needed for mild pain (pain score 1-3).   metroNIDAZOLE 1 % gel Commonly known as: METROGEL Apply 1 Application topically daily.   Olopatadine  HCl 0.2 % Soln Commonly known as: Pataday  Place 1 drop into both eyes daily as needed.   oxyCODONE  5 MG immediate release tablet Commonly known as: Oxy IR/ROXICODONE  Take 1 tablet (5 mg total) by mouth every 6 (six) hours as needed for severe pain (pain score 7-10).   tiZANidine  4 MG tablet Commonly known as: Zanaflex  Take 1 tablet (4 mg total) by mouth every 6 (six) hours as needed for muscle spasms.   valACYclovir 500 MG tablet Commonly known as: VALTREX Take 500 mg by mouth 2 (two) times daily.        Follow-up Information  Arlee Lace, MD. Schedule an appointment as soon as possible for a visit in 2 week(s).   Specialty: Obstetrics and Gynecology Contact information: [address] [place] [address] [phone]                 Signed: Arlee Lace 08/03/2023, 8:24 AM

## 2023-08-15 DIAGNOSIS — Z01419 Encounter for gynecological examination (general) (routine) without abnormal findings: Secondary | ICD-10-CM | POA: Diagnosis not present

## 2023-08-17 ENCOUNTER — Telehealth (HOSPITAL_COMMUNITY): Payer: Self-pay

## 2023-08-17 ENCOUNTER — Other Ambulatory Visit (HOSPITAL_COMMUNITY): Payer: Self-pay | Admitting: Interventional Radiology

## 2023-08-17 DIAGNOSIS — Z8249 Family history of ischemic heart disease and other diseases of the circulatory system: Secondary | ICD-10-CM

## 2023-08-17 NOTE — Telephone Encounter (Signed)
 Called to schedule cta, no answer, left vm. AB

## 2023-09-01 ENCOUNTER — Ambulatory Visit (HOSPITAL_COMMUNITY)
Admission: RE | Admit: 2023-09-01 | Discharge: 2023-09-01 | Disposition: A | Discharge status: Home / Self Care | Source: Ambulatory Visit | Attending: Interventional Radiology | Admitting: Interventional Radiology

## 2023-09-01 ENCOUNTER — Encounter (HOSPITAL_COMMUNITY): Payer: Self-pay | Admitting: Radiology

## 2023-09-01 DIAGNOSIS — M47894 Other spondylosis, thoracic region: Secondary | ICD-10-CM | POA: Insufficient documentation

## 2023-09-01 DIAGNOSIS — R9389 Abnormal findings on diagnostic imaging of other specified body structures: Secondary | ICD-10-CM | POA: Diagnosis not present

## 2023-09-01 DIAGNOSIS — E041 Nontoxic single thyroid nodule: Secondary | ICD-10-CM | POA: Diagnosis not present

## 2023-09-01 DIAGNOSIS — Z8249 Family history of ischemic heart disease and other diseases of the circulatory system: Secondary | ICD-10-CM | POA: Insufficient documentation

## 2023-09-01 DIAGNOSIS — M47812 Spondylosis without myelopathy or radiculopathy, cervical region: Secondary | ICD-10-CM | POA: Diagnosis not present

## 2023-09-01 MED ORDER — IOHEXOL 350 MG/ML SOLN
75.0000 mL | Freq: Once | INTRAVENOUS | Status: AC | PRN
  Administered 2023-09-01: 75 mL via INTRAVENOUS

## 2023-09-01 MED ORDER — SODIUM CHLORIDE (PF) 0.9 % IJ SOLN
INTRAMUSCULAR | Status: AC
  Filled 2023-09-01: qty 100

## 2023-09-02 ENCOUNTER — Encounter (HOSPITAL_COMMUNITY): Payer: Self-pay | Admitting: Interventional Radiology

## 2023-09-13 ENCOUNTER — Telehealth (HOSPITAL_COMMUNITY): Payer: Self-pay

## 2023-09-13 NOTE — Telephone Encounter (Signed)
-----   Message from Carlin DELENA Griffon sent at 09/13/2023 12:55 PM EDT ----- Regarding: CTA f/u. Dr. Dolphus recommends no further follow-up. Patient has not shown evidence of any aneurysms on imaging, and remains asymptomatic, from my understanding. Dr. Dolphus recommends follow-up with PCP/Neuro if patient develops any new or bothersome symptoms, or patient may call 911/urgently present to ED with any concerning or serious new symptoms.  Carlin Griffon, PA-C.

## 2023-09-13 NOTE — Telephone Encounter (Signed)
 Pt agreed with this plan below. AB

## 2023-10-27 DIAGNOSIS — Z8759 Personal history of other complications of pregnancy, childbirth and the puerperium: Secondary | ICD-10-CM | POA: Diagnosis not present

## 2023-10-31 DIAGNOSIS — Z8759 Personal history of other complications of pregnancy, childbirth and the puerperium: Secondary | ICD-10-CM | POA: Diagnosis not present

## 2023-11-02 DIAGNOSIS — Z1331 Encounter for screening for depression: Secondary | ICD-10-CM | POA: Diagnosis not present

## 2023-11-02 DIAGNOSIS — N912 Amenorrhea, unspecified: Secondary | ICD-10-CM | POA: Diagnosis not present

## 2023-11-02 DIAGNOSIS — Z8759 Personal history of other complications of pregnancy, childbirth and the puerperium: Secondary | ICD-10-CM | POA: Diagnosis not present

## 2023-11-02 DIAGNOSIS — N911 Secondary amenorrhea: Secondary | ICD-10-CM | POA: Diagnosis not present

## 2023-11-02 DIAGNOSIS — Z3687 Encounter for antenatal screening for uncertain dates: Secondary | ICD-10-CM | POA: Diagnosis not present

## 2023-11-14 DIAGNOSIS — Z3687 Encounter for antenatal screening for uncertain dates: Secondary | ICD-10-CM | POA: Diagnosis not present

## 2023-11-14 DIAGNOSIS — O208 Other hemorrhage in early pregnancy: Secondary | ICD-10-CM | POA: Diagnosis not present

## 2023-11-14 DIAGNOSIS — N911 Secondary amenorrhea: Secondary | ICD-10-CM | POA: Diagnosis not present

## 2023-11-14 DIAGNOSIS — O219 Vomiting of pregnancy, unspecified: Secondary | ICD-10-CM | POA: Diagnosis not present

## 2023-12-12 DIAGNOSIS — Z3480 Encounter for supervision of other normal pregnancy, unspecified trimester: Secondary | ICD-10-CM | POA: Diagnosis not present

## 2023-12-12 DIAGNOSIS — N898 Other specified noninflammatory disorders of vagina: Secondary | ICD-10-CM | POA: Diagnosis not present

## 2024-01-09 DIAGNOSIS — Z3480 Encounter for supervision of other normal pregnancy, unspecified trimester: Secondary | ICD-10-CM | POA: Diagnosis not present

## 2024-01-09 DIAGNOSIS — Z1331 Encounter for screening for depression: Secondary | ICD-10-CM | POA: Diagnosis not present

## 2024-01-16 DIAGNOSIS — R102 Pelvic and perineal pain unspecified side: Secondary | ICD-10-CM | POA: Diagnosis not present

## 2024-01-16 DIAGNOSIS — Z3A16 16 weeks gestation of pregnancy: Secondary | ICD-10-CM | POA: Diagnosis not present

## 2024-01-16 DIAGNOSIS — O26899 Other specified pregnancy related conditions, unspecified trimester: Secondary | ICD-10-CM | POA: Diagnosis not present

## 2024-01-16 DIAGNOSIS — O26852 Spotting complicating pregnancy, second trimester: Secondary | ICD-10-CM | POA: Diagnosis not present

## 2024-01-16 DIAGNOSIS — N898 Other specified noninflammatory disorders of vagina: Secondary | ICD-10-CM | POA: Diagnosis not present

## 2024-02-08 DIAGNOSIS — Z3689 Encounter for other specified antenatal screening: Secondary | ICD-10-CM | POA: Diagnosis not present

## 2024-02-08 DIAGNOSIS — Z3480 Encounter for supervision of other normal pregnancy, unspecified trimester: Secondary | ICD-10-CM | POA: Diagnosis not present
